# Patient Record
Sex: Male | Born: 1950
Health system: Southern US, Community
[De-identification: ages and names within clinical notes are randomized; demographics above are authoritative.]

## PROBLEM LIST (undated history)

## (undated) DIAGNOSIS — I499 Cardiac arrhythmia, unspecified: Secondary | ICD-10-CM

## (undated) HISTORY — PX: ROTATOR CUFF REPAIR: SHX139

---

## 2015-09-11 ENCOUNTER — Emergency Department (HOSPITAL_BASED_OUTPATIENT_CLINIC_OR_DEPARTMENT_OTHER): Payer: Self-pay

## 2015-09-11 ENCOUNTER — Encounter (HOSPITAL_BASED_OUTPATIENT_CLINIC_OR_DEPARTMENT_OTHER): Payer: Self-pay | Admitting: Emergency Medicine

## 2015-09-11 ENCOUNTER — Emergency Department (HOSPITAL_BASED_OUTPATIENT_CLINIC_OR_DEPARTMENT_OTHER)
Admission: EM | Admit: 2015-09-11 | Discharge: 2015-09-11 | Disposition: A | Discharge status: Home / Self Care | Payer: Self-pay | Attending: Emergency Medicine | Admitting: Emergency Medicine

## 2015-09-11 DIAGNOSIS — Y92009 Unspecified place in unspecified non-institutional (private) residence as the place of occurrence of the external cause: Secondary | ICD-10-CM | POA: Insufficient documentation

## 2015-09-11 DIAGNOSIS — Y999 Unspecified external cause status: Secondary | ICD-10-CM | POA: Insufficient documentation

## 2015-09-11 DIAGNOSIS — Y9302 Activity, running: Secondary | ICD-10-CM | POA: Insufficient documentation

## 2015-09-11 DIAGNOSIS — S60221A Contusion of right hand, initial encounter: Secondary | ICD-10-CM | POA: Insufficient documentation

## 2015-09-11 DIAGNOSIS — W108XXA Fall (on) (from) other stairs and steps, initial encounter: Secondary | ICD-10-CM | POA: Insufficient documentation

## 2015-09-11 DIAGNOSIS — Z7982 Long term (current) use of aspirin: Secondary | ICD-10-CM | POA: Insufficient documentation

## 2015-09-11 NOTE — ED Notes (Signed)
Pt A/Ox4 ambulatory in room. Pt noted to be moving shoulder and wrist w/o difficulty. Pt states he has some soreness to R shoulder, R wrist.

## 2015-09-11 NOTE — ED Provider Notes (Signed)
CSN: [id number]     Arrival date & time 09/11/15  1538 History  By signing my name below, I, Levon HedgerElizabeth Hall, attest that this documentation has been prepared under the direction and in the presence of Gwyneth SproutWhitney Abrielle Finck, MD . Electronically Signed: Levon HedgerElizabeth Hall, Scribe. 09/11/2015. 4:34 PM.   Chief Complaint  Patient presents with  . Fall   The history is provided by the patient. No language interpreter was used.   HPI Comments:  Gregory Conner is a 65 y.o. male who presents to the Emergency Department complaining of sudden onset, moderate right wrist and 3 knuckle on the right hand pain s/p fall onset ~5 hours ago. Pt reports he fell forward while running and playing with his grandson and landed on his right wrist. He also states he hit his face when he fell, but denies LOC. He notes associated shoulder pain. No alleviating factors noted.   History reviewed. No pertinent past medical history. Past Surgical History  Procedure Laterality Date  . Rotator cuff repair Left    No family history on file. Social History  Substance Use Topics  . Smoking status: Never Smoker   . Smokeless tobacco: None  . Alcohol Use: No    Review of Systems  Constitutional: Negative for fever.  Musculoskeletal: Positive for myalgias and arthralgias.  Neurological: Negative for syncope.  All other systems reviewed and are negative.   Allergies  Percocet  Home Medications   Prior to Admission medications   Medication Sig Start Date End Date Taking? Authorizing Provider  aspirin 81 MG tablet Take 81 mg by mouth daily.   Yes Historical Provider, MD   BP 125/91 mmHg  Pulse 67  Temp(Src) 98.2 F (36.8 C) (Oral)  Resp 18  Ht 5\' 8"  (1.727 m)  Wt 165 lb (74.844 kg)  BMI 25.09 kg/m2  SpO2 99% Physical Exam  Constitutional: He is oriented to person, place, and time. He appears well-developed and well-nourished. No distress.  HENT:  Head: Normocephalic and atraumatic.  Right Ear: Hearing normal.  Left  Ear: Hearing normal.  Nose: Nose normal.  Mouth/Throat: Oropharynx is clear and moist and mucous membranes are normal.  Eyes: Conjunctivae and EOM are normal. Pupils are equal, round, and reactive to light.  Neck: Normal range of motion. Neck supple. No spinous process tenderness present.  Cardiovascular: Normal rate, S1 normal and S2 normal.   Pulmonary/Chest: Effort normal. No respiratory distress. He exhibits no tenderness.  Abdominal: Normal appearance. There is no hepatosplenomegaly. There is no tenderness at McBurney's point and negative Murphy's sign. No hernia.  Musculoskeletal: Normal range of motion. He exhibits tenderness.  Minimal tenderness over right distal clavicle No humeral head tenderness  Full ROM of right shoulder with normal strength Pain and swelling over 3rd MCP joint with normal finger flexion and extention Wrist is normal  Neurological: He is alert and oriented to person, place, and time. He has normal strength. No cranial nerve deficit or sensory deficit. Coordination normal. GCS eye subscore is 4. GCS verbal subscore is 5. GCS motor subscore is 6.  Skin: Skin is warm, dry and intact. No rash noted. No cyanosis.  Psychiatric: He has a normal mood and affect. His speech is normal and behavior is normal. Thought content normal.  Nursing note and vitals reviewed.   ED Course  Procedures  DIAGNOSTIC STUDIES:  Oxygen Saturation is 99% on RA, normal by my interpretation.    COORDINATION OF CARE:  4:33 PM Discussed treatment plan with pt at bedside  and pt agreed to plan.  Imaging Review Dg Shoulder Right  09/11/2015  CLINICAL DATA:  Larey SeatFell down 1 step today, injuring the right shoulder. Superior pain. EXAM: RIGHT SHOULDER - 2+ VIEW COMPARISON:  None. FINDINGS: There is no evidence of fracture or dislocation. There is no evidence of arthropathy or other focal bone abnormality. Soft tissues are unremarkable. IMPRESSION: Negative. Electronically Signed   By: Paulina FusiMark  Shogry  M.D.   On: 09/11/2015 16:19   Dg Wrist Complete Right  09/11/2015  CLINICAL DATA:  Fall.  Injury. EXAM: RIGHT WRIST - COMPLETE 3+ VIEW COMPARISON:  No recent prior. FINDINGS: Mild diffuse degenerative change. No evidence fracture or dislocation. IMPRESSION: No acute or focal abnormality. Electronically Signed   By: Maisie Fushomas  Register   On: 09/11/2015 16:21   I have personally reviewed and evaluated these images and lab results as part of my medical decision-making.   MDM   Final diagnoses:  Contusion of hand, right, initial encounter    Patient is a healthy 65 year old male presenting today with shoulder and hand pain after a fall. He was chasing his grandson and slipped and fell. This happened approximately 5 hours ago. He denies any LOC or headache. He has no C-spine tenderness. Minimal tenderness over the coracoid process of the shoulder but full range of motion and low suspicion for bony injury. Imaging of the shoulder was normal. Secondly he is complaining of pain and swelling over his third MCP joints. He is able to completely flex and extend the finger. No wrist tenderness with full range of motion. Imaging of the wrist and metacarpals are within normal limits without any bony injury. Patient was diagnosed with contusion and discharged home.  I personally performed the services described in this documentation, which was scribed in my presence.  The recorded information has been reviewed and considered.     Gwyneth SproutWhitney Adir Schicker, MD 09/11/15 [phone]

## 2015-09-11 NOTE — Discharge Instructions (Signed)

## 2015-09-11 NOTE — ED Notes (Signed)
Fell from step at home  Injury to rt shoulder  And rt hand

## 2015-09-11 NOTE — ED Notes (Signed)
Patient transported to X-ray 

## 2016-02-02 ENCOUNTER — Emergency Department (HOSPITAL_BASED_OUTPATIENT_CLINIC_OR_DEPARTMENT_OTHER)
Admission: EM | Admit: 2016-02-02 | Discharge: 2016-02-02 | Disposition: A | Discharge status: Home / Self Care | Payer: Self-pay | Attending: Emergency Medicine | Admitting: Emergency Medicine

## 2016-02-02 ENCOUNTER — Emergency Department (HOSPITAL_BASED_OUTPATIENT_CLINIC_OR_DEPARTMENT_OTHER): Payer: Self-pay

## 2016-02-02 ENCOUNTER — Encounter (HOSPITAL_BASED_OUTPATIENT_CLINIC_OR_DEPARTMENT_OTHER): Payer: Self-pay | Admitting: *Deleted

## 2016-02-02 DIAGNOSIS — R42 Dizziness and giddiness: Secondary | ICD-10-CM

## 2016-02-02 DIAGNOSIS — R202 Paresthesia of skin: Secondary | ICD-10-CM | POA: Insufficient documentation

## 2016-02-02 DIAGNOSIS — Z7982 Long term (current) use of aspirin: Secondary | ICD-10-CM | POA: Insufficient documentation

## 2016-02-02 DIAGNOSIS — H9311 Tinnitus, right ear: Secondary | ICD-10-CM | POA: Insufficient documentation

## 2016-02-02 HISTORY — DX: Cardiac arrhythmia, unspecified: I49.9

## 2016-02-02 LAB — COMPREHENSIVE METABOLIC PANEL
ALBUMIN: 4.2 g/dL (ref 3.5–5.0)
ALT: 16 U/L — AB (ref 17–63)
AST: 19 U/L (ref 15–41)
Alkaline Phosphatase: 63 U/L (ref 38–126)
Anion gap: 6 (ref 5–15)
BUN: 15 mg/dL (ref 6–20)
CHLORIDE: 106 mmol/L (ref 101–111)
CO2: 28 mmol/L (ref 22–32)
CREATININE: 0.94 mg/dL (ref 0.61–1.24)
Calcium: 9 mg/dL (ref 8.9–10.3)
GFR calc Af Amer: >60 mL/min (ref >60)
GLUCOSE: 81 mg/dL (ref 65–99)
Potassium: 4.3 mmol/L (ref 3.5–5.1)
Sodium: 140 mmol/L (ref 135–145)
Total Bilirubin: 0.6 mg/dL (ref 0.3–1.2)
Total Protein: 7.2 g/dL (ref 6.5–8.1)

## 2016-02-02 LAB — CBC WITH DIFFERENTIAL/PLATELET
BASOS ABS: 0 10*3/uL (ref 0.0–0.1)
BASOS PCT: 0 %
EOS PCT: 3 %
Eosinophils Absolute: 0.1 10*3/uL (ref 0.0–0.7)
HEMATOCRIT: 40.1 % (ref 39.0–52.0)
Hemoglobin: 13.3 g/dL (ref 13.0–17.0)
LYMPHS PCT: 39 %
Lymphs Abs: 1 10*3/uL (ref 0.7–4.0)
MCH: 25.4 pg — ABNORMAL LOW (ref 26.0–34.0)
MCHC: 33.2 g/dL (ref 30.0–36.0)
MCV: 76.7 fL — AB (ref 78.0–100.0)
Monocytes Absolute: 0.3 10*3/uL (ref 0.1–1.0)
Monocytes Relative: 12 %
NEUTROS ABS: 1.1 10*3/uL — AB (ref 1.7–7.7)
Neutrophils Relative %: 46 %
PLATELETS: 190 10*3/uL (ref 150–400)
RBC: 5.23 MIL/uL (ref 4.22–5.81)
RDW: 14.5 % (ref 11.5–15.5)
WBC: 2.5 10*3/uL — AB (ref 4.0–10.5)

## 2016-02-02 LAB — URINALYSIS, ROUTINE W REFLEX MICROSCOPIC
Bilirubin Urine: NEGATIVE
GLUCOSE, UA: NEGATIVE mg/dL
HGB URINE DIPSTICK: NEGATIVE
Ketones, ur: NEGATIVE mg/dL
LEUKOCYTES UA: NEGATIVE
Nitrite: NEGATIVE
PH: 7 (ref 5.0–8.0)
PROTEIN: NEGATIVE mg/dL
SPECIFIC GRAVITY, URINE: 1.016 (ref 1.005–1.030)

## 2016-02-02 LAB — TROPONIN I

## 2016-02-02 MED ORDER — FEXOFENADINE-PSEUDOEPHED ER 60-120 MG PO TB12: 1.0000 | ORAL_TABLET | Freq: Two times a day (BID) | ORAL | 0 refills | Status: AC

## 2016-02-02 NOTE — Discharge Instructions (Signed)
Make sure to Drink plenty of fluids. Follow up with ENT specialist if continue to have ear symptoms. Follow up with orthopedics for your hand weakness. Return if any worsening symptoms.

## 2016-02-02 NOTE — ED Provider Notes (Signed)
[facility]   CSN: [id number] Arrival date & time: 02/02/16  [id number]     History   Chief Complaint Chief Complaint  Patient presents with  . Dizziness    HPI Gregory Conner is a 65 y.o. male.  HPI Gregory Conner is a 65 y.o. male presents to emergency department complaining of dizziness, intermittent pain behind left eye, sensation of heart repeat in the right ear and sinus pressure in the right face. Pt states he feels "like something popping or like I can hear my heart beat in my right ear, especially at night time. " denies pain in his ear. Denies drainage. Reports some sinus pressure on the right face. Denies changes in vision. States "sometimes when I get up too quickly I feel dizzy." Also reports left hand tingling sensation that comes and goes, has been there since his shoulder surgery 1-1/2 years ago. States he feels like it's getting worse. [place], he does not have a family doctor, orthopedics doctor. No tx prior to coming in.   Past Medical History:  Diagnosis Date  . Irregular heart beat     There are no active problems to display for this patient.   Past Surgical History:  Procedure Laterality Date  . ROTATOR CUFF REPAIR Left        Home Medications    Prior to Admission medications   Medication Sig Start Date End Date Taking? Authorizing Provider  aspirin 81 MG tablet Take 81 mg by mouth daily.   Yes Historical Provider, MD    Family History No family history on file.  Social History Social History  Substance Use Topics  . Smoking status: Never Smoker  . Smokeless tobacco: Never Used  . Alcohol use No     Allergies   Percocet [oxycodone-acetaminophen]   Review of Systems Review of Systems  Constitutional: Negative for chills and fever.  HENT: Positive for congestion and sinus pressure. Negative for ear discharge, ear pain and sore throat.   Eyes: Negative for photophobia, pain,  discharge and redness.  Respiratory: Negative for cough, chest tightness and shortness of breath.   Cardiovascular: Negative for chest pain, palpitations and leg swelling.  Gastrointestinal: Negative for abdominal distention, abdominal pain, diarrhea, nausea and vomiting.  Genitourinary: Negative for dysuria, frequency, hematuria and urgency.  Musculoskeletal: Negative for arthralgias, myalgias, neck pain and neck stiffness.  Skin: Negative for rash.  Allergic/Immunologic: Negative for immunocompromised state.  Neurological: Positive for dizziness, weakness, light-headedness and numbness. Negative for headaches.  All other systems reviewed and are negative.    Physical Exam Updated Vital Signs BP 124/82   Pulse 64   Temp 98.7 F (37.1 C) (Oral)   Resp 19   Ht 5\' 8"  (1.727 m)   Wt 77.1 kg   SpO2 99%   BMI 25.85 kg/m   Physical Exam  Constitutional: He is oriented to person, place, and time. He appears well-developed and well-nourished. No distress.  HENT:  Head: Normocephalic and atraumatic.  Right Ear: Tympanic membrane, external ear and ear canal normal.  Left Ear: Tympanic membrane, external ear and ear canal normal.  Nose: Nose normal. No mucosal edema or rhinorrhea.  Mouth/Throat: Uvula is midline, oropharynx is clear and moist and mucous membranes are normal.  Eyes: Conjunctivae and EOM are normal. Pupils are equal, round, and reactive to light.  Neck: Normal range of motion. Neck supple.  Cardiovascular: Normal rate, regular rhythm and normal heart sounds.   Pulmonary/Chest: Effort  normal and breath sounds normal. No respiratory distress. He has no wheezes. He has no rales.  Abdominal: Soft. Bowel sounds are normal. He exhibits no distension. There is no tenderness. There is no rebound.  Musculoskeletal: He exhibits no edema.  Neurological: He is alert and oriented to person, place, and time. No cranial nerve deficit.  5/5 and equal upper and lower extremity strength  bilaterally. Equal grip strength bilaterally. Normal finger to nose and heel to shin. No pronator drift. Patellar reflexes 2+   Skin: Skin is warm and dry.  Nursing note and vitals reviewed.    ED Treatments / Results  Labs (all labs ordered are listed, but only abnormal results are displayed) Labs Reviewed  CBC WITH DIFFERENTIAL/PLATELET - Abnormal; Notable for the following:       Result Value   WBC 2.5 (*)    MCV 76.7 (*)    MCH 25.4 (*)    Neutro Abs 1.1 (*)    All other components within normal limits  URINALYSIS, ROUTINE W REFLEX MICROSCOPIC (NOT AT [facility])  COMPREHENSIVE METABOLIC PANEL  TROPONIN I    EKG  EKG Interpretation  Date/Time:  Friday February 02 2016 11:15:29 EST Ventricular Rate:  64 PR Interval:    QRS Duration: 85 QT Interval:  437 QTC Calculation: 451 R Axis:   -3 Text Interpretation:  Sinus rhythm Baseline wander in lead(s) II III aVL aVF V1 V4 V6 No old tracing to compare Confirmed by [facility]  MD, MARTHA [phone]) on 02/02/2016 11:24:48 AM       Radiology Ct Head Wo Contrast  Result Date: 02/02/2016 CLINICAL DATA:  Left hand and arm numbness since left shoulder surgery in February, 2016 has worsened today. No known injury. EXAM: CT HEAD WITHOUT CONTRAST TECHNIQUE: Contiguous axial images were obtained from the base of the skull through the vertex without intravenous contrast. COMPARISON:  None. FINDINGS: Brain: Appears normal without hemorrhage, infarct, mass lesion, mass effect, midline shift or abnormal extra-axial fluid collection. No hydrocephalus or pneumocephalus. Vascular: Mild atherosclerosis noted. Skull: Intact. Sinuses/Orbits: Unremarkable. Other: None. IMPRESSION: No acute abnormality. Mild atherosclerosis. Electronically Signed   By: Drusilla Kanner M.D.   On: 02/02/2016 11:17    Procedures Procedures (including critical care time)  Medications Ordered in ED Medications - No data to display   Initial Impression / Assessment and Plan  / ED Course  I have reviewed the triage vital signs and the nursing notes.  Pertinent labs & imaging results that were available during my care of the patient were reviewed by me and considered in my medical decision making (see chart for details).  Clinical Course     Pt with multiple complaints. He is non toxic appearing. NAD. VS normal. Afebrile. No neuro deficits on exam. Daughter concern about possible CVA. I believe his numbness weakness in his hand which is not appreciated on exam, is most likely due to his prior shoulder injury. He stated to me that it has been there for year and a half. His right ear symptoms could be related to some sinus drainage. We will get CT, labs, urinalysis for further evaluation. Orthostatic vital signs ordered as well.  1:06 PM Urinalysis is normal. His CBC shows white blood cell count of 2.5, otherwise normal. His CMP is normal. He is not orthostatic. His CT head did not show any acute abnormalities. I do not think this is acute CVA. Will start him on allergy medications, will have him follow-up with family doctor ear nose throat  if continues to have ear issues. Return precautions discussed.  Vitals:   02/02/16 1005 02/02/16 1200 02/02/16 1300  BP: 134/93 124/82 137/83  Pulse: 73 64 63  Resp: 16 19 15   Temp: 98.7 F (37.1 C)    TempSrc: Oral    SpO2: 100% 99% 100%  Weight: 77.1 kg    Height: 5\' 8"  (1.727 m)        Final Clinical Impressions(s) / ED Diagnoses   Final diagnoses:  Dizziness  Left hand paresthesia  Tinnitus aurium, right    New Prescriptions Discharge Medication List as of 02/02/2016  1:09 PM    START taking these medications   Details  fexofenadine-pseudoephedrine (ALLEGRA-D) 60-120 MG 12 hr tablet Take 1 tablet by mouth every 12 (twelve) hours., Starting Fri 02/02/2016, Print         Jaynie Crumbleatyana Nery Frappier, PA-C 02/02/16 1716    Jerelyn ScottMartha Linker, MD 02/03/16 [phone]

## 2016-02-02 NOTE — ED Triage Notes (Signed)
C/o shoulder surgury in Feb of 2016 and since then his left hand is numb. Today his sx are feeling like fluid in right ear,  congestion  Dizziness and feeling like he is going to pass out. Pt states he has not passed. Also intermittant and infrequent fleeting pain in left eye.

## 2016-02-22 ENCOUNTER — Encounter (HOSPITAL_BASED_OUTPATIENT_CLINIC_OR_DEPARTMENT_OTHER): Payer: Self-pay

## 2016-02-22 ENCOUNTER — Emergency Department (HOSPITAL_BASED_OUTPATIENT_CLINIC_OR_DEPARTMENT_OTHER)
Admission: EM | Admit: 2016-02-22 | Discharge: 2016-02-22 | Disposition: A | Discharge status: Home / Self Care | Payer: Self-pay | Attending: Emergency Medicine | Admitting: Emergency Medicine

## 2016-02-22 DIAGNOSIS — H1132 Conjunctival hemorrhage, left eye: Secondary | ICD-10-CM

## 2016-02-22 DIAGNOSIS — H5789 Other specified disorders of eye and adnexa: Secondary | ICD-10-CM

## 2016-02-22 DIAGNOSIS — Z7982 Long term (current) use of aspirin: Secondary | ICD-10-CM | POA: Insufficient documentation

## 2016-02-22 MED ORDER — FLUORESCEIN SODIUM 0.6 MG OP STRP
1.0000 | ORAL_STRIP | Freq: Once | OPHTHALMIC | Status: AC
  Administered 2016-02-22: 1 via OPHTHALMIC
  Filled 2016-02-22: qty 1

## 2016-02-22 MED ORDER — TETRACAINE HCL 0.5 % OP SOLN
2.0000 [drp] | Freq: Once | OPHTHALMIC | Status: AC
  Administered 2016-02-22: 2 [drp] via OPHTHALMIC
  Filled 2016-02-22: qty 4

## 2016-02-22 NOTE — ED Triage Notes (Signed)
Pt reports waking up this morning and his left eye was itching, states he scratched it and felt "a ping" in the back of his eye. Left eye now red and sore. Denies known injury.

## 2016-02-22 NOTE — Discharge Instructions (Signed)
Your bleeding should resolve within 2-3 weeks. If you have any new or worsening symptoms, please call Dr. Laruth BouchardGroat's office or return to emergency department immediately.

## 2016-02-22 NOTE — ED Provider Notes (Signed)
[facility]   CSN: [id number] Arrival date & time: 02/22/16  0901     History   Chief Complaint Chief Complaint  Patient presents with  . Eye Pain    HPI Gregory Conner is a 65 y.o. male who is previously healthy who presents with sudden onset left eye pain and redness began this morning. Patient reports waking up and feeling an itch to his eye and rubbed it with the back of his hand and immediately felt something "pop"or a "ping" in the back of his eye and immediately had diffuse redness. Patient reports he's had increasing pain since onset. He has no change in vision. He does report increased photophobia in the affected eye. Patient wears glasses at all times, but no contacts. Patient denies any other symptoms. Patient denies any chest pain, shortness of breath, abdominal pain, nausea, vomiting, urinary symptoms. Patient denies use of anticoagulants.  HPI  Past Medical History:  Diagnosis Date  . Irregular heart beat     There are no active problems to display for this patient.   Past Surgical History:  Procedure Laterality Date  . ROTATOR CUFF REPAIR Left        Home Medications    Prior to Admission medications   Medication Sig Start Date End Date Taking? Authorizing Provider  aspirin 81 MG tablet Take 81 mg by mouth daily.   Yes Historical Provider, MD  fexofenadine-pseudoephedrine (ALLEGRA-D) 60-120 MG 12 hr tablet Take 1 tablet by mouth every 12 (twelve) hours. 02/02/16  Yes Jaynie Crumbleatyana Kirichenko, PA-C    Family History History reviewed. No pertinent family history.  Social History Social History  Substance Use Topics  . Smoking status: Never Smoker  . Smokeless tobacco: Never Used  . Alcohol use No     Allergies   Percocet [oxycodone-acetaminophen]   Review of Systems Review of Systems  Constitutional: Negative for chills and fever.  HENT: Negative for facial swelling and sore throat.   Eyes: Positive for photophobia,  pain, redness and itching. Negative for discharge and visual disturbance.  Respiratory: Negative for shortness of breath.   Cardiovascular: Negative for chest pain.  Gastrointestinal: Negative for abdominal pain, nausea and vomiting.  Genitourinary: Negative for dysuria.  Skin: Negative for rash and wound.  Psychiatric/Behavioral: The patient is not nervous/anxious.      Physical Exam Updated Vital Signs BP 134/83 (BP Location: Left Arm)   Pulse 89   Temp 98.3 F (36.8 C) (Oral)   Resp 16   Ht 5\' 8"  (1.727 m)   Wt 74.8 kg   SpO2 99%   BMI 25.09 kg/m   Physical Exam  Constitutional: He appears well-developed and well-nourished. No distress.  HENT:  Head: Normocephalic and atraumatic.  Mouth/Throat: Oropharynx is clear and moist. No oropharyngeal exudate.  Eyes: EOM and lids are normal. Pupils are equal, round, and reactive to light. Right eye exhibits no discharge. No foreign body present in the right eye. Left eye exhibits no discharge. No foreign body present in the left eye. Left conjunctiva has a hemorrhage. No scleral icterus.  Subconjunctival hemorrhage noted to the left eye Tenderness to the lateral aspect of the periorbital area Subconjunctival hemorrhage obstructed fluorescein exam, no abrasion noted Tono-Pen pressures averaged 22 in the left eye, 18 in the right eye Visual acuity bilateral 20/30, right 20/40, left (affected) 20/30  Neck: Normal range of motion. Neck supple. No thyromegaly present.  Cardiovascular: Normal rate, regular rhythm, normal heart sounds and intact distal pulses.  Exam reveals no gallop and no friction rub.   No murmur heard. Pulmonary/Chest: Effort normal and breath sounds normal. No stridor. No respiratory distress. He has no wheezes. He has no rales.  Abdominal: Soft. Bowel sounds are normal. He exhibits no distension. There is no tenderness. There is no rebound and no guarding.  Musculoskeletal: He exhibits no edema.  Lymphadenopathy:     He has no cervical adenopathy.  Neurological: He is alert. Coordination normal.  Skin: Skin is warm and dry. No rash noted. He is not diaphoretic. No pallor.  Psychiatric: He has a normal mood and affect.  Nursing note and vitals reviewed.    ED Treatments / Results  Labs (all labs ordered are listed, but only abnormal results are displayed) Labs Reviewed - No data to display  EKG  EKG Interpretation None       Radiology No results found.  Procedures Procedures (including critical care time)  Medications Ordered in ED Medications  fluorescein ophthalmic strip 1 strip (1 strip Left Eye Given 02/22/16 0924)  tetracaine (PONTOCAINE) 0.5 % ophthalmic solution 2 drop (2 drops Left Eye Given 02/22/16 [id number])     Initial Impression / Assessment and Plan / ED Course  I have reviewed the triage vital signs and the nursing notes.  Pertinent labs & imaging results that were available during my care of the patient were reviewed by me and considered in my medical decision making (see chart for details).  Clinical Course     Patient with subconjunctival hemorrhage with resolving irritation in the ED. Due to minor elevation in pressure in the left eye and some irritation and photophobia, I consulted ophthalmology. I spoke with Dr. Dione BoozeGroat who stated that the patient most likely only a subconjunctival hemorrhage, however he is willing to see the patient today. I advised patient that his symptoms were most likely resolve on their own, however and encouraged patient to see ophthalmology. Return and follow-up precautions discussed. Patient vitals stable throughout ED course and discharged in satisfactory condition. I also discussed patient's case with Dr. Silverio LayYao who guided the patient's management and agrees with plan.  Final Clinical Impressions(s) / ED Diagnoses   Final diagnoses:  Subconjunctival bleed, left  Irritation of left eye    New Prescriptions New Prescriptions   No medications  on file     Emi Holeslexandra M Louay Myrie, Cordelia Poche-C 02/22/16 1014    Charlynne Panderavid Hsienta Yao, MD 02/22/16 [phone]

## 2017-05-02 DIAGNOSIS — G8929 Other chronic pain: Secondary | ICD-10-CM | POA: Insufficient documentation

## 2017-05-02 DIAGNOSIS — R030 Elevated blood-pressure reading, without diagnosis of hypertension: Secondary | ICD-10-CM

## 2017-05-02 HISTORY — DX: Elevated blood-pressure reading, without diagnosis of hypertension: R03.0

## 2017-05-02 HISTORY — DX: Other chronic pain: G89.29

## 2017-10-30 IMAGING — CT CT HEAD W/O CM
3 series · 17 of 47 positions shown, 20 images · non-contrast
Comparison: None.

CLINICAL DATA: Left hand and arm numbness since left shoulder
surgery in April 2014 has worsened today. No known injury.

EXAM:
CT HEAD WITHOUT CONTRAST
TECHNIQUE: Contiguous axial images were obtained from the base of the skull
through the vertex without intravenous contrast.

[Series 2: head wo · axial · 0.45mm/px · z∈[-160,-25]mm · 11 of 33 slices shown, 14 images]
[im 3/33  brain]
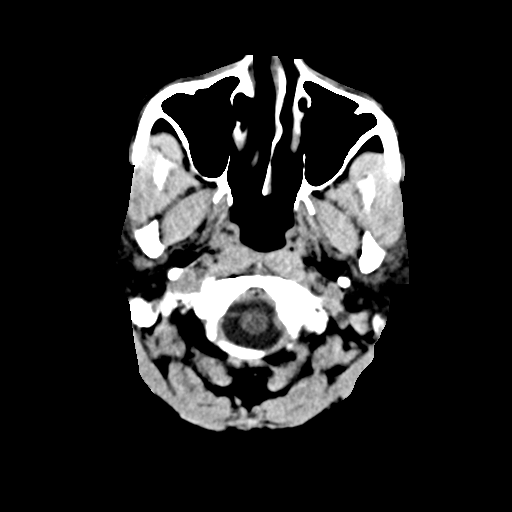
[im 3/33  bone]
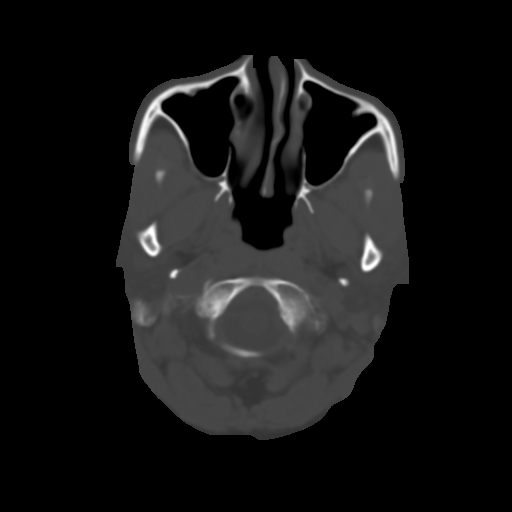
[im 5/33  brain]
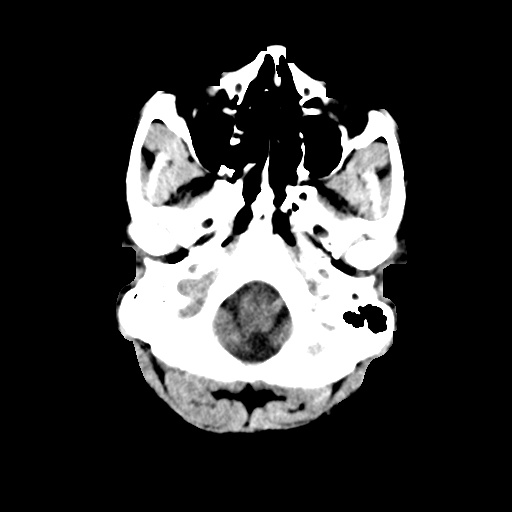
[im 8/33  brain]
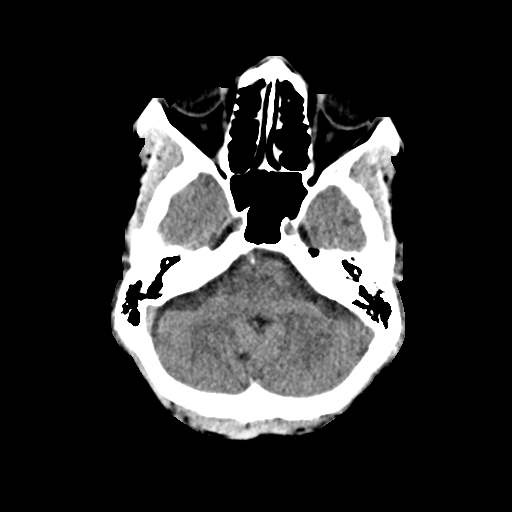
[im 10/33  brain]
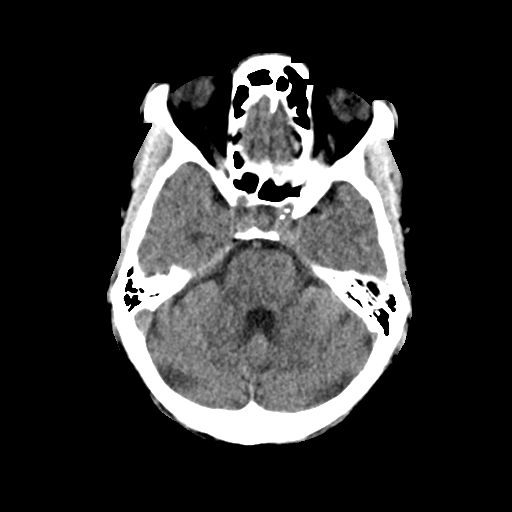
[im 14/33  brain]
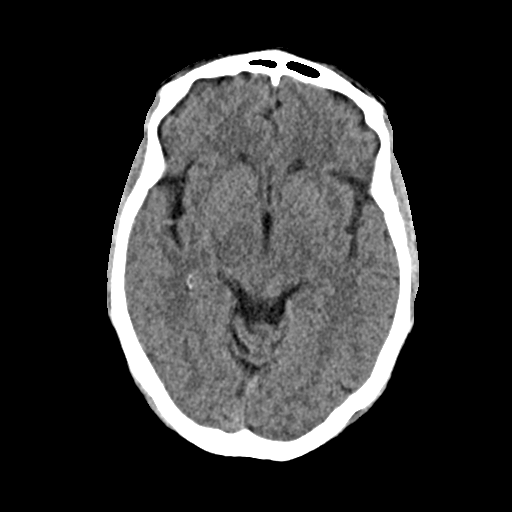
[im 14/33  bone]
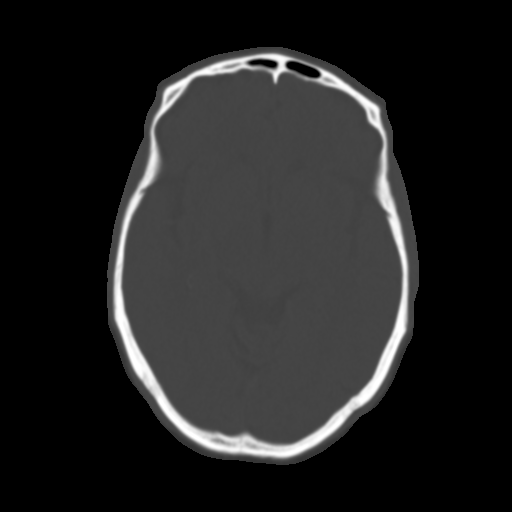
[im 17/33  brain]
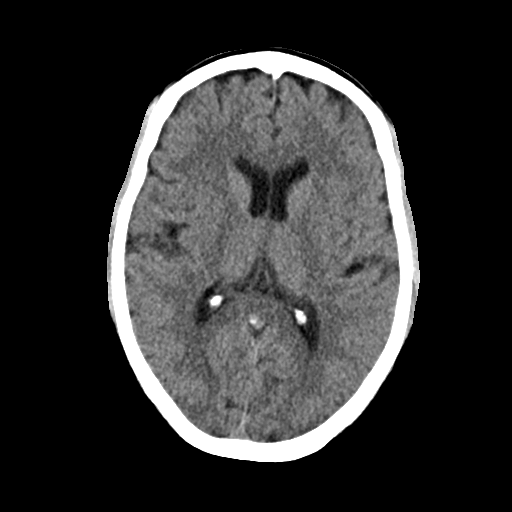
[im 19/33  brain]
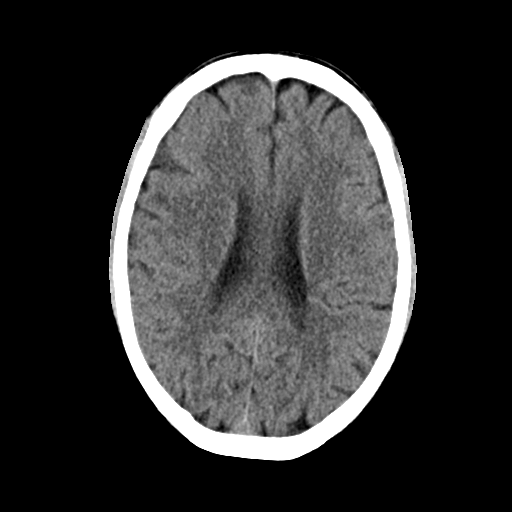
[im 23/33  brain]
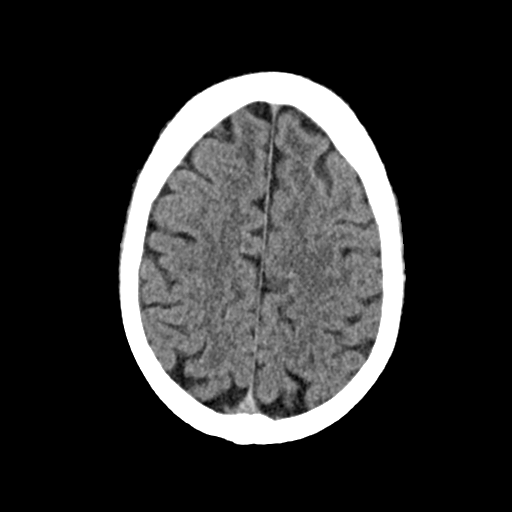
[im 25/33  brain]
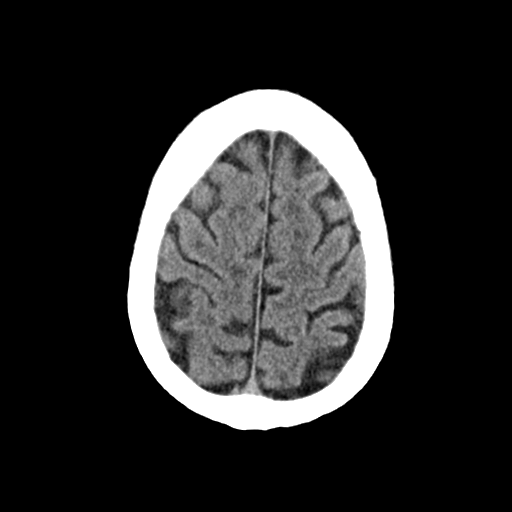
[im 25/33  bone]
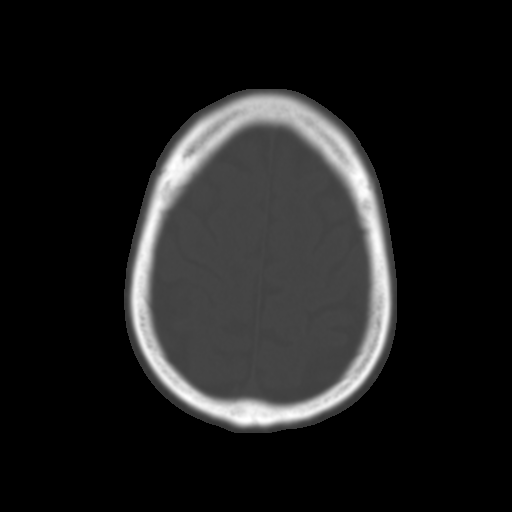
[im 28/33  brain]
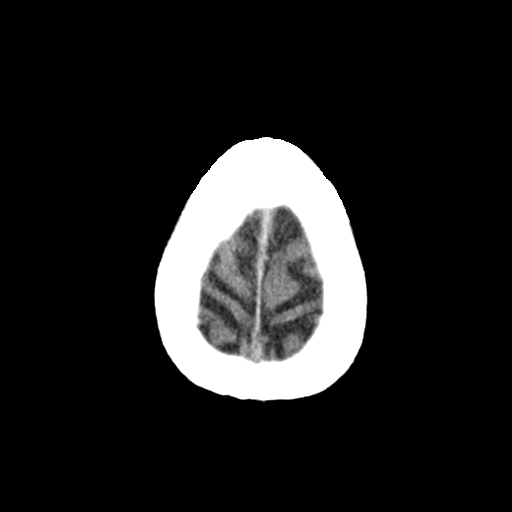
[im 30/33  brain]
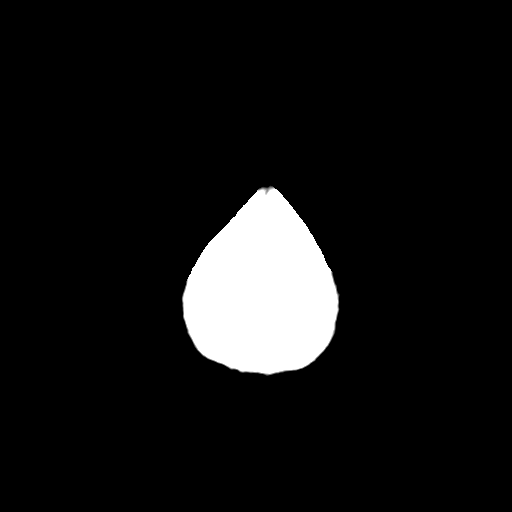

[Series 4: coronal soft · coronal · 0.41mm/px · 3 of 65 slices shown]
[im 22/65  brain]
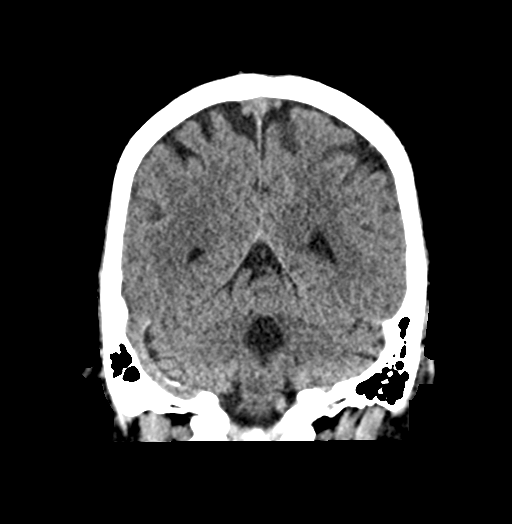
[im 29/65  brain]
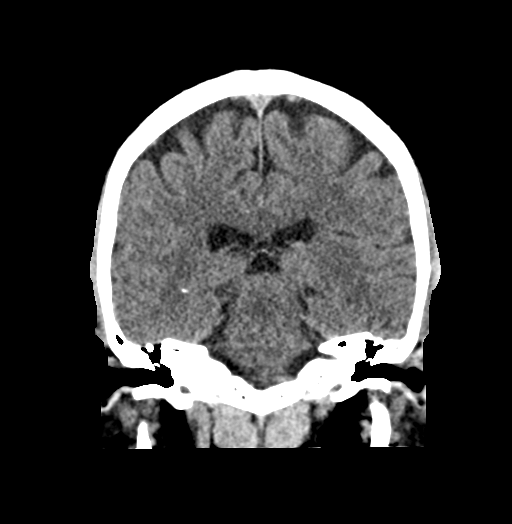
[im 36/65  brain]
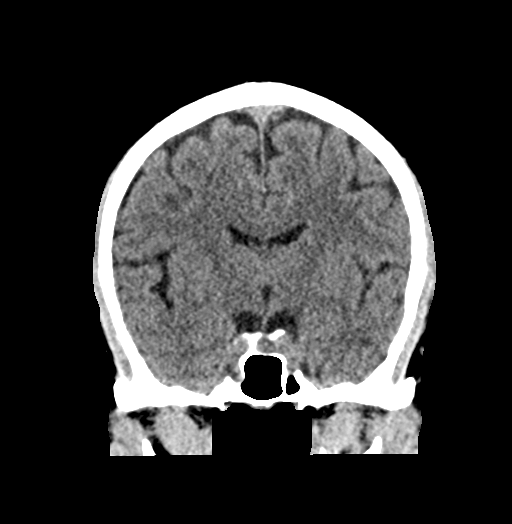

[Series 5: sag soft · sagittal · 0.41mm/px · 3 of 55 slices shown]
[im 19/55  brain]
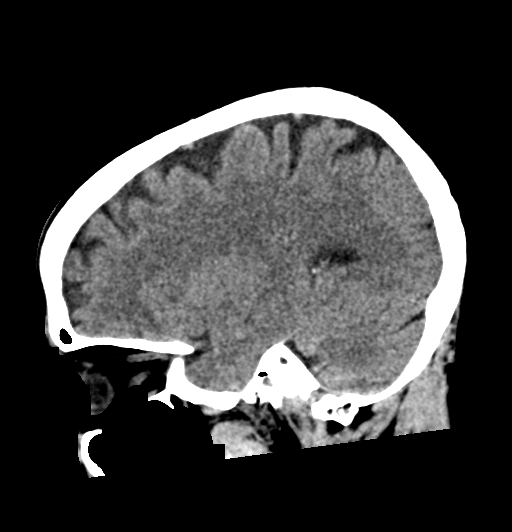
[im 28/55  brain]
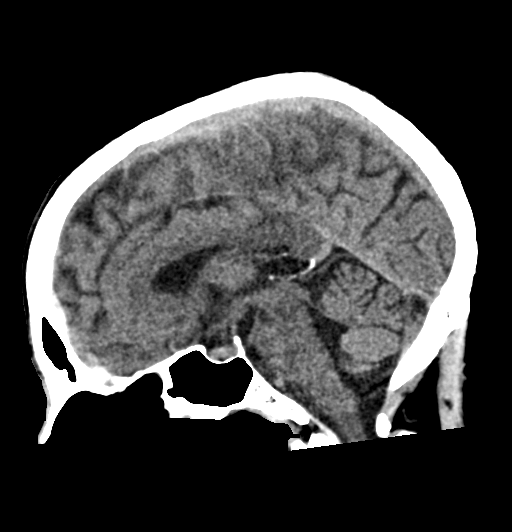
[im 37/55  brain]
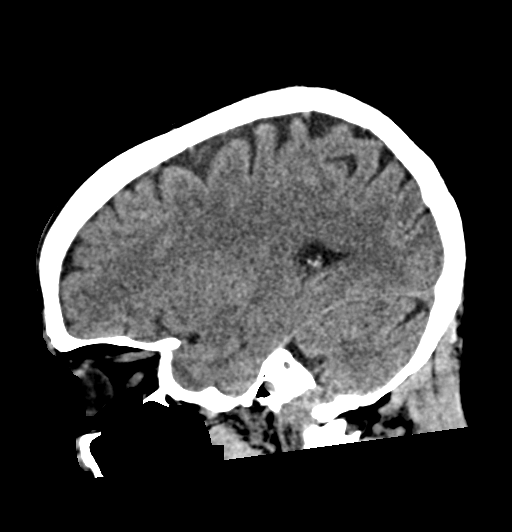

[17 of 47 positions shown; findings below may reference images not displayed]

FINDINGS: Brain: Appears normal without hemorrhage, infarct, mass lesion, mass
effect, midline shift or abnormal extra-axial fluid collection. No
hydrocephalus or pneumocephalus.

Vascular: Mild atherosclerosis noted.

Skull: Intact.

Sinuses/Orbits: Unremarkable.

Other: None.
IMPRESSION: No acute abnormality.

Mild atherosclerosis.

## 2018-03-16 DIAGNOSIS — G629 Polyneuropathy, unspecified: Secondary | ICD-10-CM | POA: Diagnosis not present

## 2018-03-16 DIAGNOSIS — Z0001 Encounter for general adult medical examination with abnormal findings: Secondary | ICD-10-CM | POA: Diagnosis not present

## 2018-03-16 DIAGNOSIS — Z87891 Personal history of nicotine dependence: Secondary | ICD-10-CM | POA: Diagnosis not present

## 2018-03-16 DIAGNOSIS — E663 Overweight: Secondary | ICD-10-CM | POA: Diagnosis not present

## 2018-03-16 DIAGNOSIS — I10 Essential (primary) hypertension: Secondary | ICD-10-CM | POA: Diagnosis not present

## 2018-03-16 DIAGNOSIS — D709 Neutropenia, unspecified: Secondary | ICD-10-CM | POA: Diagnosis not present

## 2018-03-16 DIAGNOSIS — E7800 Pure hypercholesterolemia, unspecified: Secondary | ICD-10-CM | POA: Diagnosis not present

## 2018-04-03 DIAGNOSIS — I10 Essential (primary) hypertension: Secondary | ICD-10-CM | POA: Diagnosis not present

## 2018-04-13 DIAGNOSIS — Z0001 Encounter for general adult medical examination with abnormal findings: Secondary | ICD-10-CM | POA: Diagnosis not present

## 2018-04-13 DIAGNOSIS — Z87891 Personal history of nicotine dependence: Secondary | ICD-10-CM | POA: Diagnosis not present

## 2018-04-13 DIAGNOSIS — Z131 Encounter for screening for diabetes mellitus: Secondary | ICD-10-CM | POA: Diagnosis not present

## 2018-04-13 DIAGNOSIS — Z125 Encounter for screening for malignant neoplasm of prostate: Secondary | ICD-10-CM | POA: Diagnosis not present

## 2018-04-13 DIAGNOSIS — Z01118 Encounter for examination of ears and hearing with other abnormal findings: Secondary | ICD-10-CM | POA: Diagnosis not present

## 2018-04-13 DIAGNOSIS — G629 Polyneuropathy, unspecified: Secondary | ICD-10-CM | POA: Diagnosis not present

## 2018-04-13 DIAGNOSIS — I10 Essential (primary) hypertension: Secondary | ICD-10-CM | POA: Diagnosis not present

## 2018-04-13 DIAGNOSIS — E7800 Pure hypercholesterolemia, unspecified: Secondary | ICD-10-CM | POA: Diagnosis not present

## 2018-04-13 DIAGNOSIS — E663 Overweight: Secondary | ICD-10-CM | POA: Diagnosis not present

## 2018-04-13 DIAGNOSIS — Z136 Encounter for screening for cardiovascular disorders: Secondary | ICD-10-CM | POA: Diagnosis not present

## 2018-05-25 DIAGNOSIS — E663 Overweight: Secondary | ICD-10-CM | POA: Diagnosis not present

## 2018-05-25 DIAGNOSIS — R7303 Prediabetes: Secondary | ICD-10-CM | POA: Diagnosis not present

## 2018-05-25 DIAGNOSIS — E7800 Pure hypercholesterolemia, unspecified: Secondary | ICD-10-CM | POA: Diagnosis not present

## 2018-05-25 DIAGNOSIS — G629 Polyneuropathy, unspecified: Secondary | ICD-10-CM | POA: Diagnosis not present

## 2018-05-25 DIAGNOSIS — Z87891 Personal history of nicotine dependence: Secondary | ICD-10-CM | POA: Diagnosis not present

## 2018-05-25 DIAGNOSIS — I10 Essential (primary) hypertension: Secondary | ICD-10-CM | POA: Diagnosis not present

## 2018-05-25 DIAGNOSIS — D709 Neutropenia, unspecified: Secondary | ICD-10-CM | POA: Diagnosis not present

## 2018-06-08 DIAGNOSIS — R7303 Prediabetes: Secondary | ICD-10-CM | POA: Diagnosis not present

## 2018-06-08 DIAGNOSIS — I119 Hypertensive heart disease without heart failure: Secondary | ICD-10-CM | POA: Diagnosis not present

## 2018-06-08 DIAGNOSIS — E663 Overweight: Secondary | ICD-10-CM | POA: Diagnosis not present

## 2018-06-08 DIAGNOSIS — N522 Drug-induced erectile dysfunction: Secondary | ICD-10-CM | POA: Diagnosis not present

## 2018-06-08 DIAGNOSIS — Z87891 Personal history of nicotine dependence: Secondary | ICD-10-CM | POA: Diagnosis not present

## 2018-06-08 DIAGNOSIS — G629 Polyneuropathy, unspecified: Secondary | ICD-10-CM | POA: Diagnosis not present

## 2018-06-08 DIAGNOSIS — E7800 Pure hypercholesterolemia, unspecified: Secondary | ICD-10-CM | POA: Diagnosis not present

## 2018-06-08 DIAGNOSIS — I10 Essential (primary) hypertension: Secondary | ICD-10-CM | POA: Diagnosis not present

## 2018-08-24 DIAGNOSIS — E663 Overweight: Secondary | ICD-10-CM | POA: Diagnosis not present

## 2018-08-24 DIAGNOSIS — I10 Essential (primary) hypertension: Secondary | ICD-10-CM | POA: Diagnosis not present

## 2018-08-24 DIAGNOSIS — R7303 Prediabetes: Secondary | ICD-10-CM | POA: Diagnosis not present

## 2018-08-24 DIAGNOSIS — N522 Drug-induced erectile dysfunction: Secondary | ICD-10-CM | POA: Diagnosis not present

## 2018-08-24 DIAGNOSIS — Z87891 Personal history of nicotine dependence: Secondary | ICD-10-CM | POA: Diagnosis not present

## 2018-08-24 DIAGNOSIS — E7800 Pure hypercholesterolemia, unspecified: Secondary | ICD-10-CM | POA: Diagnosis not present

## 2018-08-24 DIAGNOSIS — I119 Hypertensive heart disease without heart failure: Secondary | ICD-10-CM | POA: Diagnosis not present

## 2018-08-24 DIAGNOSIS — G629 Polyneuropathy, unspecified: Secondary | ICD-10-CM | POA: Diagnosis not present

## 2018-08-27 DIAGNOSIS — D708 Other neutropenia: Secondary | ICD-10-CM | POA: Diagnosis not present

## 2018-11-23 DIAGNOSIS — I119 Hypertensive heart disease without heart failure: Secondary | ICD-10-CM | POA: Diagnosis not present

## 2018-11-23 DIAGNOSIS — G629 Polyneuropathy, unspecified: Secondary | ICD-10-CM | POA: Diagnosis not present

## 2018-11-23 DIAGNOSIS — Z87891 Personal history of nicotine dependence: Secondary | ICD-10-CM | POA: Diagnosis not present

## 2018-11-23 DIAGNOSIS — R7303 Prediabetes: Secondary | ICD-10-CM | POA: Diagnosis not present

## 2018-11-23 DIAGNOSIS — N522 Drug-induced erectile dysfunction: Secondary | ICD-10-CM | POA: Diagnosis not present

## 2018-11-23 DIAGNOSIS — E663 Overweight: Secondary | ICD-10-CM | POA: Diagnosis not present

## 2018-11-23 DIAGNOSIS — E7800 Pure hypercholesterolemia, unspecified: Secondary | ICD-10-CM | POA: Diagnosis not present

## 2018-11-23 DIAGNOSIS — Z131 Encounter for screening for diabetes mellitus: Secondary | ICD-10-CM | POA: Diagnosis not present

## 2018-11-23 DIAGNOSIS — Z0001 Encounter for general adult medical examination with abnormal findings: Secondary | ICD-10-CM | POA: Diagnosis not present

## 2018-11-23 DIAGNOSIS — I10 Essential (primary) hypertension: Secondary | ICD-10-CM | POA: Diagnosis not present

## 2018-12-28 DIAGNOSIS — N522 Drug-induced erectile dysfunction: Secondary | ICD-10-CM | POA: Diagnosis not present

## 2018-12-28 DIAGNOSIS — I10 Essential (primary) hypertension: Secondary | ICD-10-CM | POA: Diagnosis not present

## 2018-12-28 DIAGNOSIS — Z87891 Personal history of nicotine dependence: Secondary | ICD-10-CM | POA: Diagnosis not present

## 2018-12-28 DIAGNOSIS — E663 Overweight: Secondary | ICD-10-CM | POA: Diagnosis not present

## 2018-12-28 DIAGNOSIS — R7303 Prediabetes: Secondary | ICD-10-CM | POA: Diagnosis not present

## 2018-12-28 DIAGNOSIS — E7800 Pure hypercholesterolemia, unspecified: Secondary | ICD-10-CM | POA: Diagnosis not present

## 2018-12-28 DIAGNOSIS — I119 Hypertensive heart disease without heart failure: Secondary | ICD-10-CM | POA: Diagnosis not present

## 2018-12-28 DIAGNOSIS — G629 Polyneuropathy, unspecified: Secondary | ICD-10-CM | POA: Diagnosis not present

## 2019-02-16 DIAGNOSIS — D708 Other neutropenia: Secondary | ICD-10-CM | POA: Diagnosis not present

## 2019-03-17 ENCOUNTER — Ambulatory Visit: Payer: Medicare HMO | Attending: Internal Medicine

## 2019-03-17 DIAGNOSIS — Z20822 Contact with and (suspected) exposure to covid-19: Secondary | ICD-10-CM

## 2019-03-19 LAB — NOVEL CORONAVIRUS, NAA: SARS-CoV-2, NAA: NOT DETECTED

## 2019-03-26 ENCOUNTER — Ambulatory Visit: Payer: Medicare HMO | Attending: Internal Medicine

## 2019-03-26 DIAGNOSIS — Z20822 Contact with and (suspected) exposure to covid-19: Secondary | ICD-10-CM | POA: Diagnosis not present

## 2019-03-27 LAB — NOVEL CORONAVIRUS, NAA: SARS-CoV-2, NAA: DETECTED — AB

## 2019-04-05 DIAGNOSIS — G629 Polyneuropathy, unspecified: Secondary | ICD-10-CM | POA: Diagnosis not present

## 2019-04-05 DIAGNOSIS — I10 Essential (primary) hypertension: Secondary | ICD-10-CM | POA: Diagnosis not present

## 2019-04-05 DIAGNOSIS — Z23 Encounter for immunization: Secondary | ICD-10-CM | POA: Diagnosis not present

## 2019-04-05 DIAGNOSIS — R7303 Prediabetes: Secondary | ICD-10-CM | POA: Diagnosis not present

## 2019-04-05 DIAGNOSIS — E663 Overweight: Secondary | ICD-10-CM | POA: Diagnosis not present

## 2019-04-05 DIAGNOSIS — Z87891 Personal history of nicotine dependence: Secondary | ICD-10-CM | POA: Diagnosis not present

## 2019-04-05 DIAGNOSIS — E7800 Pure hypercholesterolemia, unspecified: Secondary | ICD-10-CM | POA: Diagnosis not present

## 2019-04-05 DIAGNOSIS — N522 Drug-induced erectile dysfunction: Secondary | ICD-10-CM | POA: Diagnosis not present

## 2019-04-05 DIAGNOSIS — I119 Hypertensive heart disease without heart failure: Secondary | ICD-10-CM | POA: Diagnosis not present

## 2019-05-11 DIAGNOSIS — D708 Other neutropenia: Secondary | ICD-10-CM | POA: Diagnosis not present

## 2019-05-24 DIAGNOSIS — Z87891 Personal history of nicotine dependence: Secondary | ICD-10-CM | POA: Diagnosis not present

## 2019-05-24 DIAGNOSIS — N522 Drug-induced erectile dysfunction: Secondary | ICD-10-CM | POA: Diagnosis not present

## 2019-05-24 DIAGNOSIS — E663 Overweight: Secondary | ICD-10-CM | POA: Diagnosis not present

## 2019-05-24 DIAGNOSIS — E7800 Pure hypercholesterolemia, unspecified: Secondary | ICD-10-CM | POA: Diagnosis not present

## 2019-05-24 DIAGNOSIS — R7303 Prediabetes: Secondary | ICD-10-CM | POA: Diagnosis not present

## 2019-05-24 DIAGNOSIS — I119 Hypertensive heart disease without heart failure: Secondary | ICD-10-CM | POA: Diagnosis not present

## 2019-05-24 DIAGNOSIS — G629 Polyneuropathy, unspecified: Secondary | ICD-10-CM | POA: Diagnosis not present

## 2019-05-24 DIAGNOSIS — Z23 Encounter for immunization: Secondary | ICD-10-CM | POA: Diagnosis not present

## 2019-05-24 DIAGNOSIS — I10 Essential (primary) hypertension: Secondary | ICD-10-CM | POA: Diagnosis not present

## 2019-05-29 DIAGNOSIS — H524 Presbyopia: Secondary | ICD-10-CM | POA: Diagnosis not present

## 2019-05-29 DIAGNOSIS — H52209 Unspecified astigmatism, unspecified eye: Secondary | ICD-10-CM | POA: Diagnosis not present

## 2019-05-29 DIAGNOSIS — H5203 Hypermetropia, bilateral: Secondary | ICD-10-CM | POA: Diagnosis not present

## 2019-08-30 DIAGNOSIS — E7800 Pure hypercholesterolemia, unspecified: Secondary | ICD-10-CM | POA: Diagnosis not present

## 2019-08-30 DIAGNOSIS — Z125 Encounter for screening for malignant neoplasm of prostate: Secondary | ICD-10-CM | POA: Diagnosis not present

## 2019-08-30 DIAGNOSIS — Z1321 Encounter for screening for nutritional disorder: Secondary | ICD-10-CM | POA: Diagnosis not present

## 2019-08-30 DIAGNOSIS — N522 Drug-induced erectile dysfunction: Secondary | ICD-10-CM | POA: Diagnosis not present

## 2019-08-30 DIAGNOSIS — I10 Essential (primary) hypertension: Secondary | ICD-10-CM | POA: Diagnosis not present

## 2019-08-30 DIAGNOSIS — Z0001 Encounter for general adult medical examination with abnormal findings: Secondary | ICD-10-CM | POA: Diagnosis not present

## 2019-08-30 DIAGNOSIS — R7303 Prediabetes: Secondary | ICD-10-CM | POA: Diagnosis not present

## 2019-08-30 DIAGNOSIS — Z87891 Personal history of nicotine dependence: Secondary | ICD-10-CM | POA: Diagnosis not present

## 2019-08-30 DIAGNOSIS — G629 Polyneuropathy, unspecified: Secondary | ICD-10-CM | POA: Diagnosis not present

## 2019-08-30 DIAGNOSIS — Z01110 Encounter for hearing examination following failed hearing screening: Secondary | ICD-10-CM | POA: Diagnosis not present

## 2019-08-30 DIAGNOSIS — I119 Hypertensive heart disease without heart failure: Secondary | ICD-10-CM | POA: Diagnosis not present

## 2019-09-27 DIAGNOSIS — E663 Overweight: Secondary | ICD-10-CM | POA: Diagnosis not present

## 2019-09-27 DIAGNOSIS — I10 Essential (primary) hypertension: Secondary | ICD-10-CM | POA: Diagnosis not present

## 2019-09-27 DIAGNOSIS — R739 Hyperglycemia, unspecified: Secondary | ICD-10-CM | POA: Diagnosis not present

## 2019-09-27 DIAGNOSIS — I119 Hypertensive heart disease without heart failure: Secondary | ICD-10-CM | POA: Diagnosis not present

## 2019-09-27 DIAGNOSIS — E7800 Pure hypercholesterolemia, unspecified: Secondary | ICD-10-CM | POA: Diagnosis not present

## 2019-09-27 DIAGNOSIS — G629 Polyneuropathy, unspecified: Secondary | ICD-10-CM | POA: Diagnosis not present

## 2019-09-27 DIAGNOSIS — Z87891 Personal history of nicotine dependence: Secondary | ICD-10-CM | POA: Diagnosis not present

## 2019-09-27 DIAGNOSIS — N522 Drug-induced erectile dysfunction: Secondary | ICD-10-CM | POA: Diagnosis not present

## 2019-09-27 DIAGNOSIS — R7303 Prediabetes: Secondary | ICD-10-CM | POA: Diagnosis not present

## 2019-09-27 DIAGNOSIS — E559 Vitamin D deficiency, unspecified: Secondary | ICD-10-CM | POA: Diagnosis not present

## 2019-10-26 DIAGNOSIS — E785 Hyperlipidemia, unspecified: Secondary | ICD-10-CM | POA: Diagnosis not present

## 2019-10-26 DIAGNOSIS — Z6827 Body mass index (BMI) 27.0-27.9, adult: Secondary | ICD-10-CM | POA: Diagnosis not present

## 2019-10-26 DIAGNOSIS — E663 Overweight: Secondary | ICD-10-CM | POA: Diagnosis not present

## 2019-10-26 DIAGNOSIS — R030 Elevated blood-pressure reading, without diagnosis of hypertension: Secondary | ICD-10-CM | POA: Diagnosis not present

## 2019-10-26 DIAGNOSIS — Z7982 Long term (current) use of aspirin: Secondary | ICD-10-CM | POA: Diagnosis not present

## 2019-10-26 DIAGNOSIS — Z008 Encounter for other general examination: Secondary | ICD-10-CM | POA: Diagnosis not present

## 2019-11-29 DIAGNOSIS — Z87891 Personal history of nicotine dependence: Secondary | ICD-10-CM | POA: Diagnosis not present

## 2019-11-29 DIAGNOSIS — I10 Essential (primary) hypertension: Secondary | ICD-10-CM | POA: Diagnosis not present

## 2019-11-29 DIAGNOSIS — I119 Hypertensive heart disease without heart failure: Secondary | ICD-10-CM | POA: Diagnosis not present

## 2019-11-29 DIAGNOSIS — R7303 Prediabetes: Secondary | ICD-10-CM | POA: Diagnosis not present

## 2019-11-29 DIAGNOSIS — E663 Overweight: Secondary | ICD-10-CM | POA: Diagnosis not present

## 2019-11-29 DIAGNOSIS — E559 Vitamin D deficiency, unspecified: Secondary | ICD-10-CM | POA: Diagnosis not present

## 2019-11-29 DIAGNOSIS — G629 Polyneuropathy, unspecified: Secondary | ICD-10-CM | POA: Diagnosis not present

## 2019-11-29 DIAGNOSIS — Z0001 Encounter for general adult medical examination with abnormal findings: Secondary | ICD-10-CM | POA: Diagnosis not present

## 2019-11-29 DIAGNOSIS — E7800 Pure hypercholesterolemia, unspecified: Secondary | ICD-10-CM | POA: Diagnosis not present

## 2019-11-29 DIAGNOSIS — N522 Drug-induced erectile dysfunction: Secondary | ICD-10-CM | POA: Diagnosis not present

## 2019-12-14 DIAGNOSIS — E663 Overweight: Secondary | ICD-10-CM | POA: Diagnosis not present

## 2019-12-14 DIAGNOSIS — Z87891 Personal history of nicotine dependence: Secondary | ICD-10-CM | POA: Diagnosis not present

## 2019-12-14 DIAGNOSIS — Z23 Encounter for immunization: Secondary | ICD-10-CM | POA: Diagnosis not present

## 2019-12-14 DIAGNOSIS — I10 Essential (primary) hypertension: Secondary | ICD-10-CM | POA: Diagnosis not present

## 2019-12-14 DIAGNOSIS — E7800 Pure hypercholesterolemia, unspecified: Secondary | ICD-10-CM | POA: Diagnosis not present

## 2019-12-14 DIAGNOSIS — E559 Vitamin D deficiency, unspecified: Secondary | ICD-10-CM | POA: Diagnosis not present

## 2019-12-14 DIAGNOSIS — I119 Hypertensive heart disease without heart failure: Secondary | ICD-10-CM | POA: Diagnosis not present

## 2019-12-14 DIAGNOSIS — Z0001 Encounter for general adult medical examination with abnormal findings: Secondary | ICD-10-CM | POA: Diagnosis not present

## 2019-12-14 DIAGNOSIS — R7303 Prediabetes: Secondary | ICD-10-CM | POA: Diagnosis not present

## 2019-12-14 DIAGNOSIS — N522 Drug-induced erectile dysfunction: Secondary | ICD-10-CM | POA: Diagnosis not present

## 2019-12-14 DIAGNOSIS — G629 Polyneuropathy, unspecified: Secondary | ICD-10-CM | POA: Diagnosis not present

## 2019-12-15 DIAGNOSIS — R69 Illness, unspecified: Secondary | ICD-10-CM | POA: Diagnosis not present

## 2020-01-03 DIAGNOSIS — I119 Hypertensive heart disease without heart failure: Secondary | ICD-10-CM | POA: Diagnosis not present

## 2020-01-03 DIAGNOSIS — R7303 Prediabetes: Secondary | ICD-10-CM | POA: Diagnosis not present

## 2020-01-03 DIAGNOSIS — N522 Drug-induced erectile dysfunction: Secondary | ICD-10-CM | POA: Diagnosis not present

## 2020-01-03 DIAGNOSIS — E663 Overweight: Secondary | ICD-10-CM | POA: Diagnosis not present

## 2020-01-03 DIAGNOSIS — E559 Vitamin D deficiency, unspecified: Secondary | ICD-10-CM | POA: Diagnosis not present

## 2020-01-03 DIAGNOSIS — E7800 Pure hypercholesterolemia, unspecified: Secondary | ICD-10-CM | POA: Diagnosis not present

## 2020-01-03 DIAGNOSIS — I10 Essential (primary) hypertension: Secondary | ICD-10-CM | POA: Diagnosis not present

## 2020-01-03 DIAGNOSIS — G629 Polyneuropathy, unspecified: Secondary | ICD-10-CM | POA: Diagnosis not present

## 2020-01-03 DIAGNOSIS — Z87891 Personal history of nicotine dependence: Secondary | ICD-10-CM | POA: Diagnosis not present

## 2020-04-28 DIAGNOSIS — Z87891 Personal history of nicotine dependence: Secondary | ICD-10-CM | POA: Diagnosis not present

## 2020-04-28 DIAGNOSIS — E559 Vitamin D deficiency, unspecified: Secondary | ICD-10-CM | POA: Diagnosis not present

## 2020-04-28 DIAGNOSIS — I119 Hypertensive heart disease without heart failure: Secondary | ICD-10-CM | POA: Diagnosis not present

## 2020-04-28 DIAGNOSIS — I10 Essential (primary) hypertension: Secondary | ICD-10-CM | POA: Diagnosis not present

## 2020-04-28 DIAGNOSIS — R7303 Prediabetes: Secondary | ICD-10-CM | POA: Diagnosis not present

## 2020-04-28 DIAGNOSIS — G629 Polyneuropathy, unspecified: Secondary | ICD-10-CM | POA: Diagnosis not present

## 2020-04-28 DIAGNOSIS — N522 Drug-induced erectile dysfunction: Secondary | ICD-10-CM | POA: Diagnosis not present

## 2020-04-28 DIAGNOSIS — E663 Overweight: Secondary | ICD-10-CM | POA: Diagnosis not present

## 2020-04-28 DIAGNOSIS — E7800 Pure hypercholesterolemia, unspecified: Secondary | ICD-10-CM | POA: Diagnosis not present

## 2020-06-19 DIAGNOSIS — R7303 Prediabetes: Secondary | ICD-10-CM | POA: Diagnosis not present

## 2020-06-19 DIAGNOSIS — G629 Polyneuropathy, unspecified: Secondary | ICD-10-CM | POA: Diagnosis not present

## 2020-06-19 DIAGNOSIS — E559 Vitamin D deficiency, unspecified: Secondary | ICD-10-CM | POA: Diagnosis not present

## 2020-06-19 DIAGNOSIS — I119 Hypertensive heart disease without heart failure: Secondary | ICD-10-CM | POA: Diagnosis not present

## 2020-06-19 DIAGNOSIS — N522 Drug-induced erectile dysfunction: Secondary | ICD-10-CM | POA: Diagnosis not present

## 2020-06-19 DIAGNOSIS — Z87891 Personal history of nicotine dependence: Secondary | ICD-10-CM | POA: Diagnosis not present

## 2020-06-19 DIAGNOSIS — E663 Overweight: Secondary | ICD-10-CM | POA: Diagnosis not present

## 2020-06-19 DIAGNOSIS — I10 Essential (primary) hypertension: Secondary | ICD-10-CM | POA: Diagnosis not present

## 2020-06-19 DIAGNOSIS — E7800 Pure hypercholesterolemia, unspecified: Secondary | ICD-10-CM | POA: Diagnosis not present

## 2020-08-22 DIAGNOSIS — R7303 Prediabetes: Secondary | ICD-10-CM | POA: Diagnosis not present

## 2020-08-22 DIAGNOSIS — E7800 Pure hypercholesterolemia, unspecified: Secondary | ICD-10-CM | POA: Diagnosis not present

## 2020-08-22 DIAGNOSIS — Z131 Encounter for screening for diabetes mellitus: Secondary | ICD-10-CM | POA: Diagnosis not present

## 2020-08-22 DIAGNOSIS — I10 Essential (primary) hypertension: Secondary | ICD-10-CM | POA: Diagnosis not present

## 2020-08-22 DIAGNOSIS — Z0001 Encounter for general adult medical examination with abnormal findings: Secondary | ICD-10-CM | POA: Diagnosis not present

## 2020-08-22 DIAGNOSIS — Z125 Encounter for screening for malignant neoplasm of prostate: Secondary | ICD-10-CM | POA: Diagnosis not present

## 2020-08-22 DIAGNOSIS — Z136 Encounter for screening for cardiovascular disorders: Secondary | ICD-10-CM | POA: Diagnosis not present

## 2020-08-22 DIAGNOSIS — E559 Vitamin D deficiency, unspecified: Secondary | ICD-10-CM | POA: Diagnosis not present

## 2020-08-22 DIAGNOSIS — N522 Drug-induced erectile dysfunction: Secondary | ICD-10-CM | POA: Diagnosis not present

## 2020-08-22 DIAGNOSIS — G629 Polyneuropathy, unspecified: Secondary | ICD-10-CM | POA: Diagnosis not present

## 2020-09-21 DIAGNOSIS — I10 Essential (primary) hypertension: Secondary | ICD-10-CM | POA: Diagnosis not present

## 2020-09-21 DIAGNOSIS — N522 Drug-induced erectile dysfunction: Secondary | ICD-10-CM | POA: Diagnosis not present

## 2020-09-21 DIAGNOSIS — R7303 Prediabetes: Secondary | ICD-10-CM | POA: Diagnosis not present

## 2020-09-21 DIAGNOSIS — G629 Polyneuropathy, unspecified: Secondary | ICD-10-CM | POA: Diagnosis not present

## 2020-09-21 DIAGNOSIS — E7800 Pure hypercholesterolemia, unspecified: Secondary | ICD-10-CM | POA: Diagnosis not present

## 2020-09-21 DIAGNOSIS — E559 Vitamin D deficiency, unspecified: Secondary | ICD-10-CM | POA: Diagnosis not present

## 2020-11-28 DIAGNOSIS — G629 Polyneuropathy, unspecified: Secondary | ICD-10-CM | POA: Diagnosis not present

## 2020-11-28 DIAGNOSIS — E559 Vitamin D deficiency, unspecified: Secondary | ICD-10-CM | POA: Diagnosis not present

## 2020-11-28 DIAGNOSIS — Z87891 Personal history of nicotine dependence: Secondary | ICD-10-CM | POA: Diagnosis not present

## 2020-11-28 DIAGNOSIS — I119 Hypertensive heart disease without heart failure: Secondary | ICD-10-CM | POA: Diagnosis not present

## 2020-11-28 DIAGNOSIS — E663 Overweight: Secondary | ICD-10-CM | POA: Diagnosis not present

## 2020-11-28 DIAGNOSIS — E7800 Pure hypercholesterolemia, unspecified: Secondary | ICD-10-CM | POA: Diagnosis not present

## 2020-11-28 DIAGNOSIS — R7303 Prediabetes: Secondary | ICD-10-CM | POA: Diagnosis not present

## 2020-11-28 DIAGNOSIS — I10 Essential (primary) hypertension: Secondary | ICD-10-CM | POA: Diagnosis not present

## 2020-11-28 DIAGNOSIS — N522 Drug-induced erectile dysfunction: Secondary | ICD-10-CM | POA: Diagnosis not present

## 2020-11-28 DIAGNOSIS — Z0001 Encounter for general adult medical examination with abnormal findings: Secondary | ICD-10-CM | POA: Diagnosis not present

## 2020-12-30 DIAGNOSIS — E663 Overweight: Secondary | ICD-10-CM | POA: Diagnosis not present

## 2020-12-30 DIAGNOSIS — M792 Neuralgia and neuritis, unspecified: Secondary | ICD-10-CM | POA: Diagnosis not present

## 2020-12-30 DIAGNOSIS — Z87891 Personal history of nicotine dependence: Secondary | ICD-10-CM | POA: Diagnosis not present

## 2020-12-30 DIAGNOSIS — M1990 Unspecified osteoarthritis, unspecified site: Secondary | ICD-10-CM | POA: Diagnosis not present

## 2020-12-30 DIAGNOSIS — K5900 Constipation, unspecified: Secondary | ICD-10-CM | POA: Diagnosis not present

## 2020-12-30 DIAGNOSIS — G8929 Other chronic pain: Secondary | ICD-10-CM | POA: Diagnosis not present

## 2020-12-30 DIAGNOSIS — Z6827 Body mass index (BMI) 27.0-27.9, adult: Secondary | ICD-10-CM | POA: Diagnosis not present

## 2020-12-30 DIAGNOSIS — Z7982 Long term (current) use of aspirin: Secondary | ICD-10-CM | POA: Diagnosis not present

## 2020-12-30 DIAGNOSIS — E785 Hyperlipidemia, unspecified: Secondary | ICD-10-CM | POA: Diagnosis not present

## 2020-12-30 DIAGNOSIS — I10 Essential (primary) hypertension: Secondary | ICD-10-CM | POA: Diagnosis not present

## 2021-01-15 DIAGNOSIS — R7303 Prediabetes: Secondary | ICD-10-CM | POA: Diagnosis not present

## 2021-01-15 DIAGNOSIS — E663 Overweight: Secondary | ICD-10-CM | POA: Diagnosis not present

## 2021-01-15 DIAGNOSIS — N522 Drug-induced erectile dysfunction: Secondary | ICD-10-CM | POA: Diagnosis not present

## 2021-01-15 DIAGNOSIS — I10 Essential (primary) hypertension: Secondary | ICD-10-CM | POA: Diagnosis not present

## 2021-01-15 DIAGNOSIS — E559 Vitamin D deficiency, unspecified: Secondary | ICD-10-CM | POA: Diagnosis not present

## 2021-01-15 DIAGNOSIS — E7800 Pure hypercholesterolemia, unspecified: Secondary | ICD-10-CM | POA: Diagnosis not present

## 2021-01-15 DIAGNOSIS — Z87891 Personal history of nicotine dependence: Secondary | ICD-10-CM | POA: Diagnosis not present

## 2021-01-15 DIAGNOSIS — Z23 Encounter for immunization: Secondary | ICD-10-CM | POA: Diagnosis not present

## 2021-01-15 DIAGNOSIS — G629 Polyneuropathy, unspecified: Secondary | ICD-10-CM | POA: Diagnosis not present

## 2021-06-11 DIAGNOSIS — E7800 Pure hypercholesterolemia, unspecified: Secondary | ICD-10-CM | POA: Diagnosis not present

## 2021-10-01 DIAGNOSIS — I10 Essential (primary) hypertension: Secondary | ICD-10-CM | POA: Diagnosis not present

## 2021-10-01 DIAGNOSIS — Z131 Encounter for screening for diabetes mellitus: Secondary | ICD-10-CM | POA: Diagnosis not present

## 2021-10-01 DIAGNOSIS — R7303 Prediabetes: Secondary | ICD-10-CM | POA: Diagnosis not present

## 2021-10-01 DIAGNOSIS — E7800 Pure hypercholesterolemia, unspecified: Secondary | ICD-10-CM | POA: Diagnosis not present

## 2021-10-01 DIAGNOSIS — N522 Drug-induced erectile dysfunction: Secondary | ICD-10-CM | POA: Diagnosis not present

## 2021-10-01 DIAGNOSIS — Z0001 Encounter for general adult medical examination with abnormal findings: Secondary | ICD-10-CM | POA: Diagnosis not present

## 2021-10-01 DIAGNOSIS — Z125 Encounter for screening for malignant neoplasm of prostate: Secondary | ICD-10-CM | POA: Diagnosis not present

## 2021-10-01 DIAGNOSIS — G629 Polyneuropathy, unspecified: Secondary | ICD-10-CM | POA: Diagnosis not present

## 2021-10-29 DIAGNOSIS — E7800 Pure hypercholesterolemia, unspecified: Secondary | ICD-10-CM | POA: Diagnosis not present

## 2021-10-29 DIAGNOSIS — Z0001 Encounter for general adult medical examination with abnormal findings: Secondary | ICD-10-CM | POA: Diagnosis not present

## 2022-01-07 DIAGNOSIS — E7800 Pure hypercholesterolemia, unspecified: Secondary | ICD-10-CM | POA: Diagnosis not present

## 2022-01-07 DIAGNOSIS — G629 Polyneuropathy, unspecified: Secondary | ICD-10-CM | POA: Diagnosis not present

## 2022-01-07 DIAGNOSIS — N522 Drug-induced erectile dysfunction: Secondary | ICD-10-CM | POA: Diagnosis not present

## 2022-01-07 DIAGNOSIS — I10 Essential (primary) hypertension: Secondary | ICD-10-CM | POA: Diagnosis not present

## 2022-01-07 DIAGNOSIS — R7303 Prediabetes: Secondary | ICD-10-CM | POA: Diagnosis not present

## 2022-01-07 DIAGNOSIS — Z23 Encounter for immunization: Secondary | ICD-10-CM | POA: Diagnosis not present

## 2022-01-09 DIAGNOSIS — R7303 Prediabetes: Secondary | ICD-10-CM | POA: Diagnosis not present

## 2022-01-09 DIAGNOSIS — G629 Polyneuropathy, unspecified: Secondary | ICD-10-CM | POA: Diagnosis not present

## 2022-01-09 DIAGNOSIS — I10 Essential (primary) hypertension: Secondary | ICD-10-CM | POA: Diagnosis not present

## 2022-01-09 DIAGNOSIS — N522 Drug-induced erectile dysfunction: Secondary | ICD-10-CM | POA: Diagnosis not present

## 2022-01-09 DIAGNOSIS — E7800 Pure hypercholesterolemia, unspecified: Secondary | ICD-10-CM | POA: Diagnosis not present

## 2022-02-04 DIAGNOSIS — E1165 Type 2 diabetes mellitus with hyperglycemia: Secondary | ICD-10-CM | POA: Diagnosis not present

## 2022-02-08 DIAGNOSIS — D709 Neutropenia, unspecified: Secondary | ICD-10-CM

## 2022-02-08 HISTORY — DX: Neutropenia, unspecified: D70.9

## 2022-02-11 DIAGNOSIS — E7800 Pure hypercholesterolemia, unspecified: Secondary | ICD-10-CM | POA: Diagnosis not present

## 2022-02-11 DIAGNOSIS — D708 Other neutropenia: Secondary | ICD-10-CM | POA: Diagnosis not present

## 2022-02-11 DIAGNOSIS — N522 Drug-induced erectile dysfunction: Secondary | ICD-10-CM | POA: Diagnosis not present

## 2022-02-11 DIAGNOSIS — I10 Essential (primary) hypertension: Secondary | ICD-10-CM | POA: Diagnosis not present

## 2022-02-11 DIAGNOSIS — E1142 Type 2 diabetes mellitus with diabetic polyneuropathy: Secondary | ICD-10-CM | POA: Diagnosis not present

## 2022-05-13 DIAGNOSIS — I10 Essential (primary) hypertension: Secondary | ICD-10-CM | POA: Diagnosis not present

## 2022-05-13 DIAGNOSIS — N522 Drug-induced erectile dysfunction: Secondary | ICD-10-CM | POA: Diagnosis not present

## 2022-05-13 DIAGNOSIS — G629 Polyneuropathy, unspecified: Secondary | ICD-10-CM | POA: Diagnosis not present

## 2022-05-13 DIAGNOSIS — R7303 Prediabetes: Secondary | ICD-10-CM | POA: Diagnosis not present

## 2022-05-13 DIAGNOSIS — E7800 Pure hypercholesterolemia, unspecified: Secondary | ICD-10-CM | POA: Diagnosis not present

## 2022-07-14 ENCOUNTER — Emergency Department (HOSPITAL_BASED_OUTPATIENT_CLINIC_OR_DEPARTMENT_OTHER): Payer: Medicare PPO

## 2022-07-14 ENCOUNTER — Other Ambulatory Visit: Payer: Self-pay

## 2022-07-14 ENCOUNTER — Encounter (HOSPITAL_BASED_OUTPATIENT_CLINIC_OR_DEPARTMENT_OTHER): Payer: Self-pay | Admitting: Emergency Medicine

## 2022-07-14 ENCOUNTER — Emergency Department (HOSPITAL_BASED_OUTPATIENT_CLINIC_OR_DEPARTMENT_OTHER)
Admission: EM | Admit: 2022-07-14 | Discharge: 2022-07-14 | Disposition: A | Discharge status: Home / Self Care | Payer: Medicare PPO | Attending: Emergency Medicine | Admitting: Emergency Medicine

## 2022-07-14 DIAGNOSIS — R531 Weakness: Secondary | ICD-10-CM | POA: Insufficient documentation

## 2022-07-14 DIAGNOSIS — R002 Palpitations: Secondary | ICD-10-CM | POA: Insufficient documentation

## 2022-07-14 DIAGNOSIS — H6191 Disorder of right external ear, unspecified: Secondary | ICD-10-CM | POA: Diagnosis not present

## 2022-07-14 DIAGNOSIS — H6501 Acute serous otitis media, right ear: Secondary | ICD-10-CM | POA: Diagnosis not present

## 2022-07-14 DIAGNOSIS — Z7982 Long term (current) use of aspirin: Secondary | ICD-10-CM | POA: Insufficient documentation

## 2022-07-14 LAB — BASIC METABOLIC PANEL
Anion gap: 8 (ref 5–15)
BUN: 16 mg/dL (ref 8–23)
CO2: 27 mmol/L (ref 22–32)
Calcium: 8.5 mg/dL — ABNORMAL LOW (ref 8.9–10.3)
Chloride: 102 mmol/L (ref 98–111)
Creatinine, Ser: 0.93 mg/dL (ref 0.61–1.24)
GFR, Estimated: >60 mL/min (ref >60)
Glucose, Bld: 147 mg/dL — ABNORMAL HIGH (ref 70–99)
Potassium: 4 mmol/L (ref 3.5–5.1)
Sodium: 137 mmol/L (ref 135–145)

## 2022-07-14 LAB — CBC
HCT: 41.6 % (ref 39.0–52.0)
Hemoglobin: 13.3 g/dL (ref 13.0–17.0)
MCH: 25.5 pg — ABNORMAL LOW (ref 26.0–34.0)
MCHC: 32 g/dL (ref 30.0–36.0)
MCV: 79.8 fL — ABNORMAL LOW (ref 80.0–100.0)
Platelets: 209 10*3/uL (ref 150–400)
RBC: 5.21 MIL/uL (ref 4.22–5.81)
RDW: 15.7 % — ABNORMAL HIGH (ref 11.5–15.5)
WBC: 3 10*3/uL — ABNORMAL LOW (ref 4.0–10.5)
nRBC: 0 % (ref 0.0–0.2)

## 2022-07-14 LAB — TROPONIN I (HIGH SENSITIVITY)
Troponin I (High Sensitivity): 3 ng/L (ref <18)
Troponin I (High Sensitivity): 3 ng/L (ref <18)

## 2022-07-14 MED ORDER — SODIUM CHLORIDE 0.9 % IV BOLUS
1000.0000 mL | Freq: Once | INTRAVENOUS | Status: AC
  Administered 2022-07-14: 1000 mL via INTRAVENOUS

## 2022-07-14 NOTE — ED Triage Notes (Signed)
Pt POV steady gait-  C/o 'funny feeling that my bp is high then low' also c/o palpitations x2 weeks.  Denies CP at this time.    Also c/o difficulty hearing out of right ear x2 weeks.   Hx of htn, high cholesterol.  Reports compliance with meds.

## 2022-07-14 NOTE — Discharge Instructions (Addendum)
Thank you for allowing me to be a part of your care today.   I recommend taking over-the-counter antihistamine such as Allegra or Zyrtec to help with the fluid build up in your ear.  I also recommend using Flonase nasal spray daily as this can help the tubes that drain your ears.  I recommend scheduling a follow-up appointment with your primary care doctor about your ear.  If you do not have a primary care doctor, call the [facility] phone number provided to schedule with one.    I have referred you to cardiology to schedule a follow-up appointment.

## 2022-07-14 NOTE — ED Provider Notes (Signed)
[facility] Provider Note   CSN: [id number] Arrival date & time: 07/14/22  1314     History  Chief Complaint  Patient presents with   Palpitations    Gregory Conner is a 72 y.o. male with past medical history significant for neutropenia and irregular heart beat presents to the ED complaining of a "funny feeling" and palpitations for the past 2 weeks.  Patient feels that his blood pressure is high and then low.  He has also had difficulty hearing out of his right ear for 2 weeks and states it feels "full".  Patient states he was at the grocery store earlier when he felt like he was going to pass out and had a "heaviness" in his chest.  He states during these episodes where he feels his heart racing, and his blood pressure changing, he feels generally weak and light-headed.  Denies fever, chest pain, shortness of breath, chest tightness, nausea, vomiting, dizziness, facial asymmetry, difficulty talking or swallowing, syncope, numbness.         Home Medications Prior to Admission medications   Medication Sig Start Date End Date Taking? Authorizing Provider  aspirin 81 MG tablet Take 81 mg by mouth daily.    [provider]  fexofenadine-pseudoephedrine (ALLEGRA-D) 60-120 MG 12 hr tablet Take 1 tablet by mouth every 12 (twelve) hours. 02/02/16   Jaynie Crumble, PA-C      Allergies    Percocet [oxycodone-acetaminophen]    Review of Systems   Review of Systems  Constitutional:  Negative for fever.  Respiratory:  Negative for chest tightness and shortness of breath.   Cardiovascular:  Positive for palpitations. Negative for chest pain.  Gastrointestinal:  Negative for nausea and vomiting.  Neurological:  Positive for weakness and light-headedness. Negative for dizziness, syncope, facial asymmetry, speech difficulty and numbness.    Physical Exam Updated Vital Signs BP (!) 150/88   Pulse 82   Temp 97.8 F (36.6 C) (Oral)    Resp 18   SpO2 100%  Physical Exam Vitals and nursing note reviewed.  Constitutional:      General: He is not in acute distress.    Appearance: He is not ill-appearing.  HENT:     Right Ear: Decreased hearing noted. A middle ear effusion is present. There is no impacted cerumen. Tympanic membrane is not injected, erythematous or bulging.     Left Ear: Tympanic membrane, ear canal and external ear normal.     Ears:     Comments: Mild erythema to the right EAC.     Mouth/Throat:     Mouth: Mucous membranes are moist.     Pharynx: Oropharynx is clear.  Cardiovascular:     Rate and Rhythm: Normal rate and regular rhythm.     Pulses: Normal pulses.     Heart sounds: Normal heart sounds.  Pulmonary:     Effort: Pulmonary effort is normal. No respiratory distress.     Breath sounds: Normal breath sounds and air entry.  Abdominal:     General: Abdomen is flat. Bowel sounds are normal. There is no distension.     Palpations: Abdomen is soft.     Tenderness: There is no abdominal tenderness.  Skin:    General: Skin is warm and dry.     Capillary Refill: Capillary refill takes less than 2 seconds.  Neurological:     Mental Status: He is alert and oriented to person, place, and time. Mental status is at  baseline.     GCS: GCS eye subscore is 4. GCS verbal subscore is 5. GCS motor subscore is 6.     Sensory: Sensation is intact.     Motor: Motor function is intact.     Coordination: Coordination is intact.     Gait: Gait is intact.     Comments: Patient moves all extremities appropriately and has steady gait when walking.  No syncopal episodes or unsteadiness witnessed when patient went from lying, to sitting, to standing.    Psychiatric:        Mood and Affect: Mood normal.        Behavior: Behavior normal.     ED Results / Procedures / Treatments   Labs (all labs ordered are listed, but only abnormal results are displayed) Labs Reviewed  BASIC METABOLIC PANEL  CBC  TROPONIN I  (HIGH SENSITIVITY)    EKG None  Radiology DG Chest 2 View  Result Date: 07/14/2022 CLINICAL DATA:  palpitations EXAM: CHEST - 2 VIEW COMPARISON:  None Available. FINDINGS: Lungs are clear. Heart size and mediastinal contours are within normal limits. No effusion. Visualized bones unremarkable. IMPRESSION: No acute cardiopulmonary disease. Electronically Signed   By: Corlis Leak M.D.   On: 07/14/2022 14:16    Procedures Procedures    Medications Ordered in ED Medications - No data to display  ED Course/ Medical Decision Making/ A&P Clinical Course as of 07/14/22 1648  Sun Jul 14, 2022  1647 Stable  23 YOM with palpitations Near synocpe event today Obsed for 4 hours and asymptomatic here DC and FU with cards [CC]    Clinical Course User Index [CC] Glyn Ade, MD                             Medical Decision Making Amount and/or Complexity of Data Reviewed Labs: ordered. Radiology: ordered.   This patient presents to the ED with chief complaint(s) of blood pressure changes, palpitations, difficulty hearing out of right ear for 2 weeks with pertinent past medical history of irregular heart beat, neutropenia.  The complaint involves an extensive differential diagnosis and also carries with it a high risk of complications and morbidity.    The differential diagnosis includes ACS, cardiac dysrhythmia, electrolyte disturbances, dehydration   The initial plan is to obtain baseline labs and troponin  Initial Assessment:   Exam significant for a well-appearing patient who is not in acute distress.  Heart rate is normal in the 80s with regular rhythm.  No appreciable murmurs.  Lungs clear to auscultation bilaterally.  Abdomen is soft and non tender to palpation.  Right EAC with mild erythema and mid-ear effusion.  TM is not bulging or erythematous.   Independent ECG/labs interpretation:  The following labs were independently interpreted:  CBC with mild neutropenia, no anemia.   Metabolic panel without major electrolyte disturbance.  Troponin negative.  ECG demonstrates sinus rhythm.    Independent visualization and interpretation of imaging: I independently visualized the following imaging with scope of interpretation limited to determining acute life threatening conditions related to emergency care: chest x-ray, which revealed no evidence of pleural effusion, infiltrate, or consolidation.  Heart is of normal size.  I agree with radiologist interpretation.    Treatment and Reassessment: Will give patient IV fluid bolus and reassess.  Patient has not had any episodes of syncope.  Vitals have remained stable.   Disposition:   Will refer patient to cardiology for follow-up  due to having palpitations and near syncope.  Recommended patient keep a log of his blood pressures at home.  Patient has serous otitis media, recommended antihistamines and Flonase to help clear the fluid.  No evidence of infection at this time.  Recommended primary care follow-up if symptoms persist.    The patient has been appropriately medically screened and/or stabilized in the ED. I have low suspicion for any other emergent medical condition which would require further screening, evaluation or treatment in the ED or require inpatient management. At time of discharge the patient is hemodynamically stable and in no acute distress. I have discussed work-up results and diagnosis with patient and answered all questions. Patient is agreeable with discharge plan. We discussed strict return precautions for returning to the emergency department and they verbalized understanding.            Final Clinical Impression(s) / ED Diagnoses Final diagnoses:  None    Rx / DC Orders ED Discharge Orders     None         Lenard Simmer, PA-C 07/14/22 1655    Glyn Ade, MD 07/15/22 1534

## 2022-07-15 ENCOUNTER — Other Ambulatory Visit: Payer: Self-pay

## 2022-07-15 DIAGNOSIS — I499 Cardiac arrhythmia, unspecified: Secondary | ICD-10-CM | POA: Insufficient documentation

## 2022-07-16 ENCOUNTER — Ambulatory Visit: Payer: Medicare PPO | Admitting: Cardiology

## 2022-08-13 DIAGNOSIS — D708 Other neutropenia: Secondary | ICD-10-CM | POA: Diagnosis not present

## 2022-08-19 DIAGNOSIS — R7303 Prediabetes: Secondary | ICD-10-CM | POA: Diagnosis not present

## 2022-08-19 DIAGNOSIS — G629 Polyneuropathy, unspecified: Secondary | ICD-10-CM | POA: Diagnosis not present

## 2022-08-19 DIAGNOSIS — E7800 Pure hypercholesterolemia, unspecified: Secondary | ICD-10-CM | POA: Diagnosis not present

## 2022-08-19 DIAGNOSIS — N522 Drug-induced erectile dysfunction: Secondary | ICD-10-CM | POA: Diagnosis not present

## 2022-08-19 DIAGNOSIS — Z0001 Encounter for general adult medical examination with abnormal findings: Secondary | ICD-10-CM | POA: Diagnosis not present

## 2022-08-19 DIAGNOSIS — Z125 Encounter for screening for malignant neoplasm of prostate: Secondary | ICD-10-CM | POA: Diagnosis not present

## 2022-08-19 DIAGNOSIS — I10 Essential (primary) hypertension: Secondary | ICD-10-CM | POA: Diagnosis not present

## 2022-10-01 DIAGNOSIS — I499 Cardiac arrhythmia, unspecified: Secondary | ICD-10-CM | POA: Diagnosis not present

## 2022-10-04 DIAGNOSIS — K648 Other hemorrhoids: Secondary | ICD-10-CM | POA: Diagnosis not present

## 2022-10-04 DIAGNOSIS — K635 Polyp of colon: Secondary | ICD-10-CM | POA: Diagnosis not present

## 2022-10-04 DIAGNOSIS — D122 Benign neoplasm of ascending colon: Secondary | ICD-10-CM | POA: Diagnosis not present

## 2022-10-04 DIAGNOSIS — Z1211 Encounter for screening for malignant neoplasm of colon: Secondary | ICD-10-CM | POA: Diagnosis not present

## 2022-10-04 DIAGNOSIS — K5730 Diverticulosis of large intestine without perforation or abscess without bleeding: Secondary | ICD-10-CM | POA: Diagnosis not present

## 2022-10-14 DIAGNOSIS — E7800 Pure hypercholesterolemia, unspecified: Secondary | ICD-10-CM | POA: Diagnosis not present

## 2022-10-14 DIAGNOSIS — R7303 Prediabetes: Secondary | ICD-10-CM | POA: Diagnosis not present

## 2022-10-14 DIAGNOSIS — G629 Polyneuropathy, unspecified: Secondary | ICD-10-CM | POA: Diagnosis not present

## 2022-10-14 DIAGNOSIS — I10 Essential (primary) hypertension: Secondary | ICD-10-CM | POA: Diagnosis not present

## 2022-10-14 DIAGNOSIS — N522 Drug-induced erectile dysfunction: Secondary | ICD-10-CM | POA: Diagnosis not present

## 2022-10-31 DIAGNOSIS — N522 Drug-induced erectile dysfunction: Secondary | ICD-10-CM | POA: Diagnosis not present

## 2022-10-31 DIAGNOSIS — E7800 Pure hypercholesterolemia, unspecified: Secondary | ICD-10-CM | POA: Diagnosis not present

## 2022-10-31 DIAGNOSIS — I10 Essential (primary) hypertension: Secondary | ICD-10-CM | POA: Diagnosis not present

## 2022-10-31 DIAGNOSIS — Z0001 Encounter for general adult medical examination with abnormal findings: Secondary | ICD-10-CM | POA: Diagnosis not present

## 2022-10-31 DIAGNOSIS — R7303 Prediabetes: Secondary | ICD-10-CM | POA: Diagnosis not present

## 2022-10-31 DIAGNOSIS — G629 Polyneuropathy, unspecified: Secondary | ICD-10-CM | POA: Diagnosis not present

## 2022-12-05 DIAGNOSIS — I517 Cardiomegaly: Secondary | ICD-10-CM | POA: Diagnosis not present

## 2022-12-23 DIAGNOSIS — N522 Drug-induced erectile dysfunction: Secondary | ICD-10-CM | POA: Diagnosis not present

## 2022-12-23 DIAGNOSIS — E7800 Pure hypercholesterolemia, unspecified: Secondary | ICD-10-CM | POA: Diagnosis not present

## 2022-12-23 DIAGNOSIS — R7303 Prediabetes: Secondary | ICD-10-CM | POA: Diagnosis not present

## 2022-12-23 DIAGNOSIS — G629 Polyneuropathy, unspecified: Secondary | ICD-10-CM | POA: Diagnosis not present

## 2022-12-23 DIAGNOSIS — I10 Essential (primary) hypertension: Secondary | ICD-10-CM | POA: Diagnosis not present

## 2023-01-07 DIAGNOSIS — Z1211 Encounter for screening for malignant neoplasm of colon: Secondary | ICD-10-CM | POA: Diagnosis not present

## 2023-01-07 DIAGNOSIS — Z860100 Personal history of colon polyps, unspecified: Secondary | ICD-10-CM | POA: Diagnosis not present

## 2023-01-07 DIAGNOSIS — D122 Benign neoplasm of ascending colon: Secondary | ICD-10-CM | POA: Diagnosis not present

## 2023-01-07 DIAGNOSIS — K5730 Diverticulosis of large intestine without perforation or abscess without bleeding: Secondary | ICD-10-CM | POA: Diagnosis not present

## 2023-01-07 DIAGNOSIS — K648 Other hemorrhoids: Secondary | ICD-10-CM | POA: Diagnosis not present

## 2023-01-27 DIAGNOSIS — G629 Polyneuropathy, unspecified: Secondary | ICD-10-CM | POA: Diagnosis not present

## 2023-01-27 DIAGNOSIS — N522 Drug-induced erectile dysfunction: Secondary | ICD-10-CM | POA: Diagnosis not present

## 2023-01-27 DIAGNOSIS — R7303 Prediabetes: Secondary | ICD-10-CM | POA: Diagnosis not present

## 2023-01-27 DIAGNOSIS — I10 Essential (primary) hypertension: Secondary | ICD-10-CM | POA: Diagnosis not present

## 2023-01-27 DIAGNOSIS — Z23 Encounter for immunization: Secondary | ICD-10-CM | POA: Diagnosis not present

## 2023-01-27 DIAGNOSIS — E7800 Pure hypercholesterolemia, unspecified: Secondary | ICD-10-CM | POA: Diagnosis not present

## 2023-02-17 DIAGNOSIS — D708 Other neutropenia: Secondary | ICD-10-CM | POA: Diagnosis not present

## 2023-02-24 DIAGNOSIS — E7800 Pure hypercholesterolemia, unspecified: Secondary | ICD-10-CM | POA: Diagnosis not present

## 2023-02-24 DIAGNOSIS — R7303 Prediabetes: Secondary | ICD-10-CM | POA: Diagnosis not present

## 2023-02-24 DIAGNOSIS — N522 Drug-induced erectile dysfunction: Secondary | ICD-10-CM | POA: Diagnosis not present

## 2023-02-24 DIAGNOSIS — G629 Polyneuropathy, unspecified: Secondary | ICD-10-CM | POA: Diagnosis not present

## 2023-02-24 DIAGNOSIS — I10 Essential (primary) hypertension: Secondary | ICD-10-CM | POA: Diagnosis not present

## 2023-05-19 DIAGNOSIS — D708 Other neutropenia: Secondary | ICD-10-CM | POA: Diagnosis not present

## 2023-06-17 ENCOUNTER — Telehealth: Payer: Self-pay

## 2023-06-17 NOTE — Progress Notes (Signed)
   06/17/2023  Patient ID: Gregory Conner, male   DOB: Aug 28, 1950, 73 y.o.   MRN: [id number]  Contacted patient regarding medication adherence from a quality report for [facility]. The patient failed MAC in 2024.    Per [facility] and payor portal fill history: Rosuvastatin 40 mg - last filled 04/03/23 for a 90-day supply.  Losartan-Hydrochlorothiazide 100-12.5 mg - last filled 04/03/23 for a 90-day supply.  Spoke with patient and discussed the importance of medication adherence. He is out of the [place] for his daughter's wedding but authorized refills, will be back in [place] towards end of April. I will assist the patient in refilling these 2 medications.   Thank you for allowing pharmacy to be a part of this patient's care.   Harlon Flor, PharmD Clinical Pharmacist  [phone]

## 2023-08-11 NOTE — Progress Notes (Signed)
   08/11/2023  Patient ID: Gregory Conner, male   DOB: Jul 12, 1950, 73 y.o.   MRN: [id number]  Contacted patient regarding medication adherence from a quality report for [facility]. The patient failed MAC in 2024.    Per [facility] and payor portal fill history: Rosuvastatin 40 mg - last filled 04/03/23 for a 90-day supply.  Losartan-Hydrochlorothiazide 100-12.5 mg - last filled 04/03/23 for a 90-day supply.  Spoke with patient and discussed the importance of medication adherence. He is out of the state, has moved to [place] . I counseled the patient on transferring his prescriptions to a local pharmacy. He will find one and reach back out for assistance if needed, he will also look for a new PCP. I will collaborate with PCP to ensure he has enough refills to cover him.    Thank you for allowing pharmacy to be a part of this patient's care.   Livia Riffle, PharmD Clinical Pharmacist  [phone]
